# Patient Record
Sex: Female | Born: 1965
Health system: Southern US, Community
[De-identification: ages and names within clinical notes are randomized; demographics above are authoritative.]

## PROBLEM LIST (undated history)

## (undated) DIAGNOSIS — E785 Hyperlipidemia, unspecified: Secondary | ICD-10-CM

## (undated) DIAGNOSIS — I82409 Acute embolism and thrombosis of unspecified deep veins of unspecified lower extremity: Secondary | ICD-10-CM

## (undated) DIAGNOSIS — I10 Essential (primary) hypertension: Secondary | ICD-10-CM

## (undated) DIAGNOSIS — I890 Lymphedema, not elsewhere classified: Secondary | ICD-10-CM

## (undated) DIAGNOSIS — J189 Pneumonia, unspecified organism: Secondary | ICD-10-CM

## (undated) DIAGNOSIS — D649 Anemia, unspecified: Secondary | ICD-10-CM

## (undated) DIAGNOSIS — J40 Bronchitis, not specified as acute or chronic: Secondary | ICD-10-CM

## (undated) HISTORY — DX: Lymphedema, not elsewhere classified: I89.0

## (undated) HISTORY — DX: Anemia, unspecified: D64.9

---

## 2018-10-08 ENCOUNTER — Encounter (HOSPITAL_COMMUNITY): Payer: Self-pay

## 2018-10-08 ENCOUNTER — Emergency Department (HOSPITAL_COMMUNITY): Payer: Medicaid Other

## 2018-10-08 ENCOUNTER — Other Ambulatory Visit: Payer: Self-pay

## 2018-10-08 ENCOUNTER — Encounter (HOSPITAL_COMMUNITY): Payer: Self-pay | Admitting: Anesthesiology

## 2018-10-08 ENCOUNTER — Encounter (HOSPITAL_COMMUNITY)
Admission: EM | Disposition: A | Discharge status: Home / Self Care | Payer: Self-pay | Source: Home / Self Care | Attending: Internal Medicine

## 2018-10-08 ENCOUNTER — Inpatient Hospital Stay (HOSPITAL_COMMUNITY)
Admission: EM | Admit: 2018-10-08 | Discharge: 2018-10-16 | DRG: 299 | Disposition: A | Discharge status: Home / Self Care | Payer: Medicaid Other | Attending: Internal Medicine | Admitting: Internal Medicine

## 2018-10-08 DIAGNOSIS — M170 Bilateral primary osteoarthritis of knee: Secondary | ICD-10-CM | POA: Diagnosis present

## 2018-10-08 DIAGNOSIS — I10 Essential (primary) hypertension: Secondary | ICD-10-CM | POA: Diagnosis present

## 2018-10-08 DIAGNOSIS — Z885 Allergy status to narcotic agent status: Secondary | ICD-10-CM | POA: Diagnosis not present

## 2018-10-08 DIAGNOSIS — Z9119 Patient's noncompliance with other medical treatment and regimen: Secondary | ICD-10-CM

## 2018-10-08 DIAGNOSIS — N393 Stress incontinence (female) (male): Secondary | ICD-10-CM | POA: Diagnosis present

## 2018-10-08 DIAGNOSIS — I82412 Acute embolism and thrombosis of left femoral vein: Secondary | ICD-10-CM | POA: Diagnosis present

## 2018-10-08 DIAGNOSIS — Z7901 Long term (current) use of anticoagulants: Secondary | ICD-10-CM

## 2018-10-08 DIAGNOSIS — R0902 Hypoxemia: Secondary | ICD-10-CM | POA: Diagnosis present

## 2018-10-08 DIAGNOSIS — I82409 Acute embolism and thrombosis of unspecified deep veins of unspecified lower extremity: Secondary | ICD-10-CM | POA: Diagnosis present

## 2018-10-08 DIAGNOSIS — Z6841 Body Mass Index (BMI) 40.0 and over, adult: Secondary | ICD-10-CM | POA: Diagnosis not present

## 2018-10-08 DIAGNOSIS — E785 Hyperlipidemia, unspecified: Secondary | ICD-10-CM | POA: Diagnosis present

## 2018-10-08 DIAGNOSIS — I824Y2 Acute embolism and thrombosis of unspecified deep veins of left proximal lower extremity: Secondary | ICD-10-CM

## 2018-10-08 DIAGNOSIS — I890 Lymphedema, not elsewhere classified: Secondary | ICD-10-CM | POA: Diagnosis present

## 2018-10-08 DIAGNOSIS — Z8249 Family history of ischemic heart disease and other diseases of the circulatory system: Secondary | ICD-10-CM

## 2018-10-08 DIAGNOSIS — M79606 Pain in leg, unspecified: Secondary | ICD-10-CM | POA: Diagnosis present

## 2018-10-08 DIAGNOSIS — D509 Iron deficiency anemia, unspecified: Secondary | ICD-10-CM | POA: Diagnosis present

## 2018-10-08 DIAGNOSIS — I2699 Other pulmonary embolism without acute cor pulmonale: Secondary | ICD-10-CM

## 2018-10-08 DIAGNOSIS — D649 Anemia, unspecified: Secondary | ICD-10-CM

## 2018-10-08 DIAGNOSIS — I82402 Acute embolism and thrombosis of unspecified deep veins of left lower extremity: Secondary | ICD-10-CM

## 2018-10-08 DIAGNOSIS — I82422 Acute embolism and thrombosis of left iliac vein: Secondary | ICD-10-CM | POA: Diagnosis present

## 2018-10-08 DIAGNOSIS — R079 Chest pain, unspecified: Secondary | ICD-10-CM

## 2018-10-08 HISTORY — DX: Pneumonia, unspecified organism: J18.9

## 2018-10-08 HISTORY — DX: Essential (primary) hypertension: I10

## 2018-10-08 HISTORY — DX: Morbid (severe) obesity due to excess calories: E66.01

## 2018-10-08 HISTORY — DX: Hyperlipidemia, unspecified: E78.5

## 2018-10-08 LAB — CBC WITH DIFFERENTIAL/PLATELET
Abs Immature Granulocytes: 0.06 10*3/uL (ref 0.00–0.07)
Basophils Absolute: 0.1 10*3/uL (ref 0.0–0.1)
Basophils Relative: 1 %
Eosinophils Absolute: 0.2 10*3/uL (ref 0.0–0.5)
Eosinophils Relative: 1 %
HCT: 30.9 % — ABNORMAL LOW (ref 36.0–46.0)
Hemoglobin: 7.8 g/dL — ABNORMAL LOW (ref 12.0–15.0)
Immature Granulocytes: 1 %
Lymphocytes Relative: 11 %
Lymphs Abs: 1.3 10*3/uL (ref 0.7–4.0)
MCH: 16.7 pg — ABNORMAL LOW (ref 26.0–34.0)
MCHC: 25.2 g/dL — ABNORMAL LOW (ref 30.0–36.0)
MCV: 66.2 fL — ABNORMAL LOW (ref 80.0–100.0)
Monocytes Absolute: 0.7 10*3/uL (ref 0.1–1.0)
Monocytes Relative: 6 %
Neutro Abs: 9.5 10*3/uL — ABNORMAL HIGH (ref 1.7–7.7)
Neutrophils Relative %: 80 %
Platelets: 365 10*3/uL (ref 150–400)
RBC: 4.67 MIL/uL (ref 3.87–5.11)
RDW: 23.3 % — ABNORMAL HIGH (ref 11.5–15.5)
WBC: 11.7 10*3/uL — ABNORMAL HIGH (ref 4.0–10.5)
nRBC: 0.3 % — ABNORMAL HIGH (ref 0.0–0.2)

## 2018-10-08 LAB — TYPE AND SCREEN
ABO/RH(D): O POS
Antibody Screen: NEGATIVE

## 2018-10-08 LAB — COMPREHENSIVE METABOLIC PANEL
ALT: 21 U/L (ref 0–44)
AST: 22 U/L (ref 15–41)
Albumin: 3.3 g/dL — ABNORMAL LOW (ref 3.5–5.0)
Alkaline Phosphatase: 84 U/L (ref 38–126)
Anion gap: 11 (ref 5–15)
BUN: 14 mg/dL (ref 6–20)
CO2: 24 mmol/L (ref 22–32)
Calcium: 8.7 mg/dL — ABNORMAL LOW (ref 8.9–10.3)
Chloride: 98 mmol/L (ref 98–111)
Creatinine, Ser: 1.05 mg/dL — ABNORMAL HIGH (ref 0.44–1.00)
GFR calc Af Amer: >60 mL/min (ref >60)
GFR calc non Af Amer: >60 mL/min (ref >60)
Glucose, Bld: 124 mg/dL — ABNORMAL HIGH (ref 70–99)
Potassium: 3.3 mmol/L — ABNORMAL LOW (ref 3.5–5.1)
Sodium: 133 mmol/L — ABNORMAL LOW (ref 135–145)
Total Bilirubin: 0.8 mg/dL (ref 0.3–1.2)
Total Protein: 8.4 g/dL — ABNORMAL HIGH (ref 6.5–8.1)

## 2018-10-08 LAB — HEMOGLOBIN A1C
Hgb A1c MFr Bld: 5.4 % (ref 4.8–5.6)
Mean Plasma Glucose: 108.28 mg/dL

## 2018-10-08 LAB — HEPARIN LEVEL (UNFRACTIONATED)
Heparin Unfractionated: <0.1 IU/mL — ABNORMAL LOW (ref 0.30–0.70)
Heparin Unfractionated: 0.6 IU/mL (ref 0.30–0.70)

## 2018-10-08 LAB — TROPONIN I
Troponin I: <0.03 ng/mL (ref <0.03)
Troponin I: 0.06 ng/mL (ref <0.03)

## 2018-10-08 LAB — D-DIMER, QUANTITATIVE (NOT AT ARMC): D-Dimer, Quant: 16.03 ug/mL-FEU — ABNORMAL HIGH (ref 0.00–0.50)

## 2018-10-08 LAB — PROTIME-INR
INR: 1.27
Prothrombin Time: 15.8 seconds — ABNORMAL HIGH (ref 11.4–15.2)

## 2018-10-08 LAB — MAGNESIUM: Magnesium: 2.2 mg/dL (ref 1.7–2.4)

## 2018-10-08 LAB — BRAIN NATRIURETIC PEPTIDE: B Natriuretic Peptide: 28 pg/mL (ref 0.0–100.0)

## 2018-10-08 SURGERY — THROMBECTOMY, VEIN, PERCUTANEOUS
Anesthesia: General | Laterality: Left

## 2018-10-08 MED ORDER — SODIUM CHLORIDE 0.9% FLUSH: 3.0000 mL | INTRAVENOUS | Status: DC | PRN

## 2018-10-08 MED ORDER — HEPARIN (PORCINE) 25000 UT/250ML-% IV SOLN
1700.0000 [IU]/h | INTRAVENOUS | Status: DC
  Administered 2018-10-08 – 2018-10-10 (×4): 1800 [IU]/h via INTRAVENOUS
  Administered 2018-10-11 – 2018-10-15 (×8): 1900 [IU]/h via INTRAVENOUS
  Administered 2018-10-16: 1700 [IU]/h via INTRAVENOUS
  Filled 2018-10-08 (×16): qty 250

## 2018-10-08 MED ORDER — FENTANYL CITRATE (PF) 100 MCG/2ML IJ SOLN
50.0000 ug | Freq: Once | INTRAMUSCULAR | Status: AC
  Administered 2018-10-08: 50 ug via INTRAVENOUS
  Filled 2018-10-08: qty 2

## 2018-10-08 MED ORDER — ONDANSETRON HCL 4 MG/2ML IJ SOLN: 4.0000 mg | Freq: Four times a day (QID) | INTRAMUSCULAR | Status: DC | PRN

## 2018-10-08 MED ORDER — SODIUM CHLORIDE 0.9 % IV SOLN: 250.0000 mL | INTRAVENOUS | Status: DC | PRN

## 2018-10-08 MED ORDER — ZOLPIDEM TARTRATE 5 MG PO TABS: 5.0000 mg | ORAL_TABLET | Freq: Every evening | ORAL | Status: DC | PRN

## 2018-10-08 MED ORDER — ONDANSETRON HCL 4 MG PO TABS: 4.0000 mg | ORAL_TABLET | Freq: Four times a day (QID) | ORAL | Status: DC | PRN

## 2018-10-08 MED ORDER — HEPARIN BOLUS VIA INFUSION
5000.0000 [IU] | Freq: Once | INTRAVENOUS | Status: AC
  Administered 2018-10-08: 5000 [IU] via INTRAVENOUS

## 2018-10-08 MED ORDER — ACETAMINOPHEN 650 MG RE SUPP: 650.0000 mg | Freq: Four times a day (QID) | RECTAL | Status: DC | PRN

## 2018-10-08 MED ORDER — ACETAMINOPHEN 325 MG PO TABS: 650.0000 mg | ORAL_TABLET | Freq: Four times a day (QID) | ORAL | Status: DC | PRN

## 2018-10-08 MED ORDER — SODIUM CHLORIDE 0.9% FLUSH
3.0000 mL | Freq: Two times a day (BID) | INTRAVENOUS | Status: DC
  Administered 2018-10-12 – 2018-10-15 (×4): 3 mL via INTRAVENOUS

## 2018-10-08 MED ORDER — IPRATROPIUM-ALBUTEROL 0.5-2.5 (3) MG/3ML IN SOLN
3.0000 mL | Freq: Once | RESPIRATORY_TRACT | Status: DC
  Filled 2018-10-08: qty 3

## 2018-10-08 MED ORDER — ALBUTEROL SULFATE (2.5 MG/3ML) 0.083% IN NEBU: 2.5000 mg | INHALATION_SOLUTION | RESPIRATORY_TRACT | Status: DC | PRN

## 2018-10-08 MED ORDER — TECHNETIUM TO 99M ALBUMIN AGGREGATED
4.0000 | Freq: Once | INTRAVENOUS | Status: AC | PRN
  Administered 2018-10-08: 4 via INTRAVENOUS

## 2018-10-08 NOTE — ED Provider Notes (Signed)
[facility] EMERGENCY DEPARTMENT Provider Note   CSN: [id number] Arrival date & time: 10/08/18  1147     History   Chief Complaint Chief Complaint  Patient presents with  . Leg Pain    HPI Victoria Cummings is a 52 y.o. female.  HPI   52yF with several complaints. She says she called EMS for CP. Began last night. Sharp pain in L anterior chest like she is being stabbed. She is morbidly obese and very sedentary but says pain worse when she would get up to use the bathroom. Associated with dyspnea. Occasional cough. No fever. Says she is always cold. Chronic severe LE edema. Her L leg has been more swollen and painful recently but not sure of exact timing. Denies trauma. Denies past history of DVT. Also c/o feeling thirsty. Denies polyuria or dysuria. Has stress incontinence when she coughs but this is chronic.   Does not receive regular medical care. She lives locally in a hotel. Does not have a PCP. Says she has been told she has high blood pressure but is not on medications.   Past Medical History:  Diagnosis Date  . Hyperlipidemia   . Hypertension   . Morbid obesity (HCC)   . Pneumonia     There are no active problems to display for this patient.   History reviewed. No pertinent surgical history.   OB History   None      Home Medications    Prior to Admission medications   Not on File    Family History No family history on file.  Social History Social History   Tobacco Use  . Smoking status: Never Smoker  . Smokeless tobacco: Never Used  Substance Use Topics  . Alcohol use: Not Currently  . Drug use: Never     Allergies   Codeine   Review of Systems Review of Systems  All systems reviewed and negative, other than as noted in HPI.  Physical Exam Updated Vital Signs BP 126/72 (BP Location: Left Wrist)   Pulse (!) 114   Temp 97.9 F (36.6 C)   Resp (!) 30   LMP 06/08/2018 Comment: irregular  SpO2 (!) 83%   Physical Exam  Constitutional:  She appears well-developed and well-nourished. No distress.  HENT:  Head: Normocephalic and atraumatic.  Eyes: Conjunctivae are normal. Right eye exhibits no discharge. Left eye exhibits no discharge.  Neck: Neck supple.  Cardiovascular: Regular rhythm and normal heart sounds. Exam reveals no gallop and no friction rub.  No murmur heard. tachycardic  Pulmonary/Chest: She has rales.  Tachypnea. Rales b/l.   Abdominal: Soft. She exhibits no distension. There is no tenderness.  Musculoskeletal: She exhibits edema. She exhibits no tenderness.  Severe lymphedema b/l LE. L leg is noticeably larger than R. Diffuse tenderness but no acute appearing skin lesions/cellulitis. I cannot easily palpate a DP pulse but exam is very limited because of body habitus. Foot doesn't feel cold nor appreciably different than the R.   Neurological: She is alert.  Skin: Skin is warm and dry.  Psychiatric:  Flat affect. Poor eye contact. Speaks in hushed voice.   Nursing note and vitals reviewed.    ED Treatments / Results  Labs (all labs ordered are listed, but only abnormal results are displayed) Labs Reviewed  CBC WITH DIFFERENTIAL/PLATELET - Abnormal; Notable for the following components:      Result Value   WBC 11.7 (*)    Hemoglobin 7.8 (*)    HCT 30.9 (*)  MCV 66.2 (*)    MCH 16.7 (*)    MCHC 25.2 (*)    RDW 23.3 (*)    nRBC 0.3 (*)    Neutro Abs 9.5 (*)    All other components within normal limits  COMPREHENSIVE METABOLIC PANEL - Abnormal; Notable for the following components:   Sodium 133 (*)    Potassium 3.3 (*)    Glucose, Bld 124 (*)    Creatinine, Ser 1.05 (*)    Calcium 8.7 (*)    Total Protein 8.4 (*)    Albumin 3.3 (*)    All other components within normal limits  D-DIMER, QUANTITATIVE (NOT AT [facility]) - Abnormal; Notable for the following components:   D-Dimer, Quant 16.03 (*)    All other components within normal limits  PROTIME-INR - Abnormal; Notable for the following  components:   Prothrombin Time 15.8 (*)    All other components within normal limits  BRAIN NATRIURETIC PEPTIDE  TROPONIN I  MAGNESIUM  URINALYSIS, ROUTINE W REFLEX MICROSCOPIC  HEMOGLOBIN A1C  HEPARIN LEVEL (UNFRACTIONATED)  TYPE AND SCREEN    EKG EKG Interpretation  Date/Time:  Wednesday October 08 2018 12:08:45 EST Ventricular Rate:  109 PR Interval:    QRS Duration: 98 QT Interval:  344 QTC Calculation: 464 R Axis:   -33 Text Interpretation:  Sinus tachycardia Left axis deviation No old tracing to compare Confirmed by Raeford RazorKohut, Ndidi Nesby [phone]([id number]) on 10/08/2018 12:15:49 PM   Radiology [place] Venous Img Lower Bilateral  Result Date: 10/08/2018 CLINICAL DATA:  52 year old female with a history of left lower extremity pain and lymphedema EXAM: BILATERAL LOWER EXTREMITY VENOUS DOPPLER ULTRASOUND TECHNIQUE: Gray-scale sonography with graded compression, as well as color Doppler and duplex ultrasound were performed to evaluate the lower extremity deep venous systems from the level of the common femoral vein and including the common femoral, femoral, profunda femoral, popliteal and calf veins including the posterior tibial, peroneal and gastrocnemius veins when visible. The superficial great saphenous vein was also interrogated. Spectral Doppler was utilized to evaluate flow at rest and with distal augmentation maneuvers in the common femoral, femoral and popliteal veins. COMPARISON:  None. FINDINGS: RIGHT LOWER EXTREMITY Common Femoral Vein: No evidence of thrombus. Normal compressibility, respiratory phasicity and response to augmentation. Saphenofemoral Junction: No evidence of thrombus. Normal compressibility and flow on color Doppler imaging. Profunda Femoral Vein: No evidence of thrombus. Normal compressibility and flow on color Doppler imaging. Femoral Vein: No evidence of thrombus. Normal compressibility, respiratory phasicity and response to augmentation. Popliteal Vein: No evidence of  thrombus. Normal compressibility, respiratory phasicity and response to augmentation. Calf Veins: Calf veins not visualized Superficial Great Saphenous Vein: No evidence of thrombus. Normal compressibility and flow on color Doppler imaging. Other Findings:  None. LEFT LOWER EXTREMITY Common Femoral Vein: Occlusive DVT of the common femoral vein extending into the profunda femoris and the saphenofemoral junction. Thrombus extends beyond the field of view into the iliac veins. Saphenofemoral Junction: Thrombus of the saphenofemoral junction. Profunda Femoral Vein: Occlusive thrombus of the profunda vein as it joins common femoral vein Femoral Vein: Occlusive DVT through the femoral vein extending at least to the mid segment. Patient was unable to tolerate a more comprehensive exam of the distal femoral vein and popliteal vein. Popliteal Vein: Not interrogated Calf Veins: Not interrogated Other Findings:  None. IMPRESSION: Right: Sonographic survey of the right lower extremity negative for DVT. Left: Occlusive proximal DVT of the common femoral vein, profunda vein, saphenofemoral junction, and extending at least distally  to the mid femoral vein. Patient did not tolerate comprehensive exam of the distal femoral vein and popliteal vein. Evidence of extension of DVT in the left iliac veins. These results were called by telephone at the time of interpretation on 10/08/2018 at 1:06 pm to Dr. Raeford Razor , who verbally acknowledged these results. Signed, Yvone Neu. Reyne Dumas, RPVI [facility] Electronically Signed   By: Gilmer Mor D.O.   On: 10/08/2018 13:06   Dg Chest Portable 1 View  Result Date: 10/08/2018 CLINICAL DATA:  dyspnea. cough. sharp L sided CP. EXAM: PORTABLE CHEST 1 VIEW COMPARISON:  None. FINDINGS: Normal heart size. Lungs clear. No pneumothorax. No pleural effusion. IMPRESSION: No active cardiopulmonary disease. Electronically Signed   By:  Jolaine Click M.D.   On: 10/08/2018 13:07    Procedures Procedures (including critical care time)  CRITICAL CARE Performed by: Raeford Razor Total critical care time: 50 minutes Critical care time was exclusive of separately billable procedures and treating other patients. Critical care was necessary to treat or prevent imminent or life-threatening deterioration. Critical care was time spent personally by me on the following activities: development of treatment plan with patient and/or surrogate as well as nursing, discussions with consultants, evaluation of patient's response to treatment, examination of patient, obtaining history from patient or surrogate, ordering and performing treatments and interventions, ordering and review of laboratory studies, ordering and review of radiographic studies, pulse oximetry and re-evaluation of patient's condition.   Medications Ordered in ED Medications - No data to display   Initial Impression / Assessment and Plan / ED Course  I have reviewed the triage vital signs and the nursing notes.  Pertinent labs & imaging results that were available during my care of the patient were reviewed by me and considered in my medical decision making (see chart for details).     52yF with multiple complaints. Sharp L sided CP which is worse with minimal activity but says she is only capable of minimal activity. Sharp brief nature seems atypical for ACS. Will check troponin though. EKG with sinus tachycardia. NS ST changes. No old for comparison purposes.   Hypoxic on RA. Consider CHF. Consider infectious. B/l rales. Check CXR. Check BNP.   Consider PE. She is essentially bed bound. Severe LE edema, with L>R. Will [place] her LEs.  Will check d-dimer. If venous US negative and d-dimer elevated then will get a VQ scan. We cannot even stand her to get a weight. She says she is at least 450 lbs. We're not going to be able to obtain CTa even if renal function is fine  because of her weight/body habitus.   She is morbidly obese and doesn't have routine medical care. She is hypoxic on RA and not on supplemental oxygen at baseline. She will require admission. She likely has undiagnosed underlying medical problems and I'm not going to be able to immediately sort out what may be an acute issue versus more chronic. No prior records for review. She lives alone in a hotel and from my initial conversation it doesn't sound like she has much social support. Will also need SW/CM involvement.   1:09 PM Called by Dr Loreta Ave, radiology. Pt does have extensive clot in LLE extending into iliac. She may be a candidate for thrombolysis by IR, but would have to be at [facility] for this.   Clinically she had PE based on the ultrasound, sharp CP and hypoxemia. Unfractionated heparin ordered for now.  She is having a significant amount of pain in her leg. Consider phlegmasia. It's hard to get a physical exam I am confident in to monitor signs of arterial insufficiency. It's also going to be difficult to simply keep her leg elevated. I cannot doppler a DP pulse but she is so edematous and skin very thick that this isn't surprising.  I cannot even position he probe to try to assess PT pulse. Her foot doesn't feel cold.   1:48 PM I discussed briefly with Dr Early via OR staff. He is currently in surgery. Recommending admission to either hospitalist service or CCM and agrees with transfer to [facility]. Vascular surgery can follow along in consult for further recs.   2:11 PM Discussed with Dr Arbie Cookey again. He would like pt transferred to ED at [facility] expeditiously for further evaluation. Discussed with Dr Judd Lien, EDP. Hospitalist service will need to be consulted as well for the admission.   Final Clinical Impressions(s) / ED Diagnoses   Final diagnoses:  Acute deep vein thrombosis (DVT) of proximal vein of left lower extremity (HCC)  Other acute pulmonary embolism, unspecified whether acute cor  pulmonale present (HCC)  Anemia, unspecified type    ED Discharge Orders    None       Raeford Razor, MD 10/08/18 1417

## 2018-10-08 NOTE — H&P (Signed)
[facility]                                                                                    Patient Demographics  Victoria Cummings, is a 52 y.o. female  CSN: [id number]  MRN: [id number]  DOB - 1965-11-10  Admit Date - 10/08/2018  Outpatient Primary MD for the patient is Patient, No Pcp Per   With History of -  Past Medical History:  Diagnosis Date  . Hyperlipidemia   . Hypertension   . Morbid obesity (HCC)   . Pneumonia       History reviewed. No pertinent surgical history.  in for   Chief Complaint  Patient presents with  . Leg Pain     HPI  Victoria Cummings  is a 52 y.o. female, with past medical history significant for hypertension, hyperlipidemia and progressive lymphedema presenting with 1 day history of chest pain, substernal on and off, worse with exercise, nonradiating, not associated with cold sweats, nausea or vomiting.  Patient denies any history of PEs or DVTs.  Patient denies any dizziness or loss of consciousness.  Patient is chest pain-free at this time and just underwent a VQ scan which is still pending.  She was seen by vascular surgery in the ER earlier for concerns of phlegmasia but there was no evidence of arterial compromise.  Patient has a history of noncompliance and is not taking any medications at home.  Left leg Doppler done at any pen showed left extensive DVT in her left iliac veins, common femoral vein, and popliteal veins .    Review of Systems    In addition to the HPI above,  No Fever-chills, No Headache, No changes with Vision or hearing, No problems swallowing food or Liquids, No Abdominal pain, No Nausea or Vommitting, Bowel movements are regular, No Blood in stool or Urine, No dysuria, No new skin rashes or bruises, No new joints pains-aches,  No new weakness, tingling, numbness in any extremity, No recent weight gain or loss, No polyuria, polydypsia or polyphagia, No significant Mental Stressors.  A full 10  point Review of Systems was done, except as stated above, all other Review of Systems were negative.   Social History Social History   Tobacco Use  . Smoking status: Never Smoker  . Smokeless tobacco: Never Used  Substance Use Topics  . Alcohol use: Not Currently     Family History Significant for hypertension  Prior to Admission medications   Not on File    Allergies  Allergen Reactions  . Codeine     Physical Exam  Vitals  Blood pressure (!) 148/66, pulse (!) 105, temperature 98 F (36.7 C), temperature source Oral, resp. rate (!) 22, height 5\' 5"  (1.651 m), weight (!) 204.1 kg, last menstrual period 06/08/2018, SpO2 98 %.   1. General well-developed, well-nourished female looks acutely ill  2. Normal affect and insight, Not Suicidal or Homicidal, Awake Alert, Oriented X 3.  3. No F.N deficits, grossly, patient moving all extremities.  4. Ears and Eyes appear Normal, Conjunctivae clear, PERRLA. Moist Oral Mucosa.  5. Supple Neck, No JVD, No cervical lymphadenopathy appriciated, No Carotid Bruits.  6. Symmetrical Chest wall movement, Good air movement bilaterally, CTAB.  7. RRR, No Gallops, Rubs or Murmurs, No Parasternal Heave.  8. Positive Bowel Sounds, Abdomen Soft, Non tender, No organomegaly appriciated,No rebound -guarding or rigidity.  9.  No Cyanosis, Normal Skin Turgor, No Skin Rash or Bruise.  10. Good muscle tone,  joints appear normal , no effusions, Normal ROM.    Data Review  CBC Recent Labs  Lab 10/08/18 1325  WBC 11.7*  HGB 7.8*  HCT 30.9*  PLT 365  MCV 66.2*  MCH 16.7*  MCHC 25.2*  RDW 23.3*  LYMPHSABS 1.3  MONOABS 0.7  EOSABS 0.2  BASOSABS 0.1   ------------------------------------------------------------------------------------------------------------------  Chemistries  Recent Labs  Lab 10/08/18 1325  NA 133*  K 3.3*  CL 98  CO2 24  GLUCOSE 124*  BUN 14  CREATININE 1.05*  CALCIUM 8.7*  MG 2.2  AST 22  ALT  21  ALKPHOS 84  BILITOT 0.8   ------------------------------------------------------------------------------------------------------------------ estimated creatinine clearance is 112 mL/min (A) (by C-G formula based on SCr of 1.05 mg/dL (H)). ------------------------------------------------------------------------------------------------------------------ No results for input(s): TSH, T4TOTAL, T3FREE, THYROIDAB in the last 72 hours.  Invalid input(s): FREET3   Coagulation profile Recent Labs  Lab 10/08/18 1327  INR 1.27   ------------------------------------------------------------------------------------------------------------------- Recent Labs    10/08/18 1327  DDIMER 16.03*   -------------------------------------------------------------------------------------------------------------------  Cardiac Enzymes Recent Labs  Lab 10/08/18 1325  TROPONINI <0.03   ------------------------------------------------------------------------------------------------------------------ Invalid input(s): POCBNP   ---------------------------------------------------------------------------------------------------------------  Urinalysis No results found for: COLORURINE, APPEARANCEUR, LABSPEC, PHURINE, GLUCOSEU, HGBUR, BILIRUBINUR, KETONESUR, PROTEINUR, UROBILINOGEN, NITRITE, LEUKOCYTESUR  ----------------------------------------------------------------------------------------------------------------   Imaging results:   US Venous Img Lower Bilateral  Result Date: 10/08/2018 CLINICAL DATA:  52 year old female with a history of left lower extremity pain and lymphedema EXAM: BILATERAL LOWER EXTREMITY VENOUS DOPPLER ULTRASOUND TECHNIQUE: Gray-scale sonography with graded compression, as well as color Doppler and duplex ultrasound were performed to evaluate the lower extremity deep venous systems from the level of the common femoral vein and including the common femoral, femoral, profunda  femoral, popliteal and calf veins including the posterior tibial, peroneal and gastrocnemius veins when visible. The superficial great saphenous vein was also interrogated. Spectral Doppler was utilized to evaluate flow at rest and with distal augmentation maneuvers in the common femoral, femoral and popliteal veins. COMPARISON:  None. FINDINGS: RIGHT LOWER EXTREMITY Common Femoral Vein: No evidence of thrombus. Normal compressibility, respiratory phasicity and response to augmentation. Saphenofemoral Junction: No evidence of thrombus. Normal compressibility and flow on color Doppler imaging. Profunda Femoral Vein: No evidence of thrombus. Normal compressibility and flow on color Doppler imaging. Femoral Vein: No evidence of thrombus. Normal compressibility, respiratory phasicity and response to augmentation. Popliteal Vein: No evidence of thrombus. Normal compressibility, respiratory phasicity and response to augmentation. Calf Veins: Calf veins not visualized Superficial Great Saphenous Vein: No evidence of thrombus. Normal compressibility and flow on color Doppler imaging. Other Findings:  None. LEFT LOWER EXTREMITY Common Femoral Vein: Occlusive DVT of the common femoral vein extending into the profunda femoris and the saphenofemoral junction. Thrombus extends beyond the field of view into the iliac veins. Saphenofemoral Junction: Thrombus of the saphenofemoral junction. Profunda Femoral Vein: Occlusive thrombus of the profunda vein as it joins common femoral vein Femoral Vein: Occlusive DVT through the femoral vein extending at least to the mid segment. Patient was unable to tolerate a more comprehensive exam of the distal femoral vein and popliteal vein. Popliteal Vein: Not interrogated Calf Veins: Not interrogated Other Findings:  None. IMPRESSION:  Right: Sonographic survey of the right lower extremity negative for DVT. Left: Occlusive proximal DVT of the common femoral vein, profunda vein, saphenofemoral  junction, and extending at least distally to the mid femoral vein. Patient did not tolerate comprehensive exam of the distal femoral vein and popliteal vein. Evidence of extension of DVT in the left iliac veins. These results were called by telephone at the time of interpretation on 10/08/2018 at 1:06 pm to Dr. Raeford RazorSTEPHEN KOHUT , who verbally acknowledged these results. Signed, Yvone NeuJaime S. Reyne DumasWagner, DO, RPVI [facility] Electronically Signed   By: Gilmer MorJaime  Wagner D.O.   On: 10/08/2018 13:06   Dg Chest Portable 1 View  Result Date: 10/08/2018 CLINICAL DATA:  dyspnea. cough. sharp L sided CP. EXAM: PORTABLE CHEST 1 VIEW COMPARISON:  None. FINDINGS: Normal heart size. Lungs clear. No pneumothorax. No pleural effusion. IMPRESSION: No active cardiopulmonary disease. Electronically Signed   By: Jolaine ClickArthur  Hoss M.D.   On: 10/08/2018 13:07    My personal review of EKG: Rhythm NSR, sinus tach at 109 bpm with left axis deviation  Assessment & Plan  Left leg extensive DVT Will start patient on heparin drip Awaiting VQ scan results No history of hypercoagulable state or DVTs  Chest pain rule out PE , VQ scan pending Serial troponins  Progressive lymphedema of lower extremities  History of hypertension , not on medications  History of hyperlipidemia, not on medications     DVT Prophylaxis heparin full dose  AM Labs Ordered, also please review Full Orders    Code Status full  Disposition Plan: Home  Time spent in minutes : 38 minutes  Condition GUARDED   @SIGNATURE @

## 2018-10-08 NOTE — ED Triage Notes (Signed)
Pt brought in by ems. Pt lives in hotel. Originally EMS called for CP , but once arrived complained of left leg pain with lower leg hurting worse. Pt is morbidly obese with lymphedema noted

## 2018-10-08 NOTE — ED Notes (Signed)
Attempted report x 1, call back number left. 

## 2018-10-08 NOTE — ED Provider Notes (Addendum)
52 year old morbidly obese woman comes in with chief complaint of chest pain.  She was found to have DVT and elevated d-dimer.  Patient had come in with chest pain and is complaining of some shortness of breath, therefore we will order a VQ scan.  Heparin has been initiated. Dr. Arbie CookeyEarly, vascular surgery has been notified.  Patient is complaining of leg pain but she has no active numbness or tingling.  Extremities warm to touch and she is able to wiggle her toes.   Derwood KaplanNanavati, Jenetta Wease, MD 10/08/18 1533  4:14 PM  Dr. Arbie CookeyEarly, clears the patient from vascular perspective.  4:14 PM IMPRESSION: Right:  Sonographic survey of the right lower extremity negative for DVT.  Left:  Occlusive proximal DVT of the common femoral vein, profunda vein, saphenofemoral junction, and extending at least distally to the mid femoral vein. Patient did not tolerate comprehensive exam of the distal femoral vein and popliteal vein.  Evidence of extension of DVT in the left iliac veins.  These results were called by telephone at the time of interpretation on 10/08/2018 at 1:06 pm to Dr. Raeford RazorSTEPHEN KOHUT , who verbally acknowledged these results.   Derwood KaplanNanavati, Lenix Benoist, MD 10/08/18 [phone]

## 2018-10-08 NOTE — Progress Notes (Signed)
ANTICOAGULATION CONSULT NOTE - Initial Consult  Pharmacy Consult for Heparin Indication: DVT  Allergies  Allergen Reactions  . Codeine     Patient Measurements: Height: 5\' 5"  (165.1 cm) Weight: (!) 450 lb (204.1 kg) IBW/kg (Calculated) : 57 HEPARIN DW (KG): 111.1  Vital Signs: Temp: 97.9 F (36.6 C) (12/04 1157) BP: 126/72 (12/04 1157) Pulse Rate: 114 (12/04 1157)  Labs: No results for input(s): HGB, HCT, PLT, APTT, LABPROT, INR, HEPARINUNFRC, HEPRLOWMOCWT, CREATININE, CKTOTAL, CKMB, TROPONINI in the last 72 hours.  CrCl cannot be calculated (No successful lab value found.).   Medical History: Past Medical History:  Diagnosis Date  . Hyperlipidemia   . Hypertension   . Morbid obesity (HCC)   . Pneumonia     Medications:  See med rec  Assessment: 52 yo brought to ED with leg pain. Venous U/S shows Occlusive DVT of the Left common femoral vein extending into the profunda femoris and the saphenofemoral junction.Thrombus extends beyond the field of view into the iliac veins. Pharmacy asked to start heparin.  Goal of Therapy:  Heparin level 0.3-0.7 units/ml Monitor platelets by anticoagulation protocol: Yes   Plan:  Give 5000 units bolus x 1 Start heparin infusion at 1800 units/hr Check anti-Xa level in 6-8 hours and daily while on heparin Continue to monitor H&H and platelets   Elder CyphersLorie Odie Edmonds, BS Loura BackPharm D, BCPS Clinical Pharmacist Pager [phone] 10/08/2018,1:20 PM

## 2018-10-08 NOTE — ED Notes (Signed)
Pt received by this RN from [facility] via [facility] for left leg pain x 2 weeks and shob that the pt states "is not new" Pt is pain free at this time as long as she is lying still. VSS, NAD. Axox4.

## 2018-10-08 NOTE — Progress Notes (Signed)
CRITICAL VALUE ALERT  Critical Value: Troponin 0.06  Date & Time Notied:  10/08/18 2030  Provider Notified: Schorr NP  Orders Received/Actions taken: Pending

## 2018-10-08 NOTE — Progress Notes (Signed)
ANTICOAGULATION CONSULT NOTE - Follow-Up Consult  Pharmacy Consult for Heparin Indication: DVT  Allergies  Allergen Reactions  . Codeine     Patient Measurements: Height: 5\' 5"  (165.1 cm) Weight: (!) 450 lb (204.1 kg) IBW/kg (Calculated) : 57 HEPARIN DW (KG): 111.1  Vital Signs: Temp: (P) 98.4 F (36.9 C) (12/04 1836) Temp Source: (P) Oral (12/04 1836) BP: 145/65 (12/04 1728) Pulse Rate: 100 (12/04 1728)  Labs: Recent Labs    10/08/18 1325 10/08/18 1327 10/08/18 1858  HGB 7.8*  --   --   HCT 30.9*  --   --   PLT 365  --   --   LABPROT  --  15.8*  --   INR  --  1.27  --   HEPARINUNFRC  --  <0.10* 0.60  CREATININE 1.05*  --   --   TROPONINI <0.03  --   --     Estimated Creatinine Clearance: 112 mL/min (A) (by C-G formula based on SCr of 1.05 mg/dL (H)).   Medical History: Past Medical History:  Diagnosis Date  . Hyperlipidemia   . Hypertension   . Morbid obesity (HCC)   . Pneumonia      Assessment: 52 yo brought to ED with leg pain found to have acute DVT. VQ scan intermediate probability for PE. Pharmacy consulted for heparin. Initial heparin level therapeutic at 0.60.  Goal of Therapy:  Heparin level 0.3-0.7 units/ml Monitor platelets by anticoagulation protocol: Yes   Plan:  -Continue heparin 1800 units/hr -Recheck heparin level with morning labs  Fredonia HighlandMichael Payal Stanforth, PharmD, BCPS Clinical Pharmacist [phone] Please check AMION for all [facility] numbers 10/08/2018

## 2018-10-08 NOTE — Consult Note (Signed)
Vascular and Vein Specialist of [facility]  Patient name: Victoria Cummings MRN: [id number] DOB: 1966-08-25 Sex: female    HPI: Victoria Cummings is a 52 y.o. female transferred from [facility] with concern of phlegmasia.  She has a long history of severe progressive lymphedema.  She reports that this is been chronic and progressive for many years.  She is morbidly obese with limited mobility.  She reports that she began having leg pain over the last several weeks which has progressed and she presented to the [facility] emergency room today.  Also reports new stabbing chest pain and some shortness of breath as well.  She was evaluated [facility] and underwent lower extremity venous duplex which showed extensive DVT in her left iliac veins, common femoral vein, femoral vein and popliteal vein.  Noted to have motor and sensory function intact but the emergency room provider could not obtain Doppler flow and was concerned about potential phlegmasia and therefore she was transferred to [facility] for further evaluation and possible lysis.  She denies any prior history of pulmonary embolus or DVT.  No prior history of hypercoagulable state.  She has known hypertension but is not on any medication and does not seek medical help.  Past Medical History:  Diagnosis Date  . Hyperlipidemia   . Hypertension   . Morbid obesity (HCC)   . Pneumonia     No family history on file.  SOCIAL HISTORY: Social History   Tobacco Use  . Smoking status: Never Smoker  . Smokeless tobacco: Never Used  Substance Use Topics  . Alcohol use: Not Currently    Allergies  Allergen Reactions  . Codeine     Current Facility-Administered Medications  Medication Dose Route Frequency Provider Last Rate Last Dose  . heparin ADULT infusion 100 units/mL (25000 units/238mL sodium chloride 0.45%)  1,800 Units/hr Intravenous Continuous Raeford Razor, MD 18 mL/hr  at 10/08/18 1419 1,800 Units/hr at 10/08/18 1419  . ipratropium-albuterol (DUONEB) 0.5-2.5 (3) MG/3ML nebulizer solution 3 mL  3 mL Nebulization Once Raeford Razor, MD       No current outpatient medications on file.    REVIEW OF SYSTEMS:  [X]  denotes positive finding, [ ]  denotes negative finding Cardiac  Comments:  Chest pain or chest pressure: x   Shortness of breath upon exertion: x   Short of breath when lying flat: x   Irregular heart rhythm:        Vascular    Pain in calf, thigh, or hip brought on by ambulation:    Pain in feet at night that wakes you up from your sleep:     Blood clot in your veins:    Leg swelling:  x         PHYSICAL EXAM: Vitals:   10/08/18 1157 10/08/18 1317 10/08/18 1422 10/08/18 1532  BP: 126/72  (!) 146/69 (!) 148/66  Pulse: (!) 114  (!) 103 (!) 105  Resp: (!) 30  (!) 22 (!) 22  Temp: 97.9 F (36.6 C)   98 F (36.7 C)  TempSrc:    Oral  SpO2: (!) 83%  93% 98%  Weight:  (!) 204.1 kg    Height:  5\' 5"  (1.651 m)      GENERAL: The patient is a well-nourished female, in no acute distress. The vital signs are documented above. CARDIOVASCULAR: Her feet are warm bilaterally.  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  He does have some tenderness in her left leg versus her right.  She does not have palpable pulses.  She does have biphasic posterior tibial pulse bilaterally.  Her posterior tibial pulse was obtained in listening with a Doppler probe between several folds in her massive elephantitis. PULMONARY: There is good air exchange  MUSCULOSKELETAL: There are no major deformities or cyanosis. NEUROLOGIC: No focal weakness or paresthesias are detected.  Motor and sensory intact in her left and right foot SKIN: There are no ulcers or rashes noted. PSYCHIATRIC: The patient has a normal affect.  DATA:  Lower extremity duplex with extensive left leg DVT as stated  above  MEDICAL ISSUES: Extensive left leg DVT and possible PE.  No evidence of arterial compromise.  No indication for thrombolysis or mechanical thrombectomy.  Difficult management in patient with extensive progressive end-stage lymphedema bilaterally.  Recommend admission for elevation and work-up of shortness of breath and possible PE.  Recommend anticoagulation as is being done.    Larina Earthlyodd F. Viann Nielson, MD FACS [facility] Office Tel [phone] Pager [phone]

## 2018-10-08 NOTE — Anesthesia Preprocedure Evaluation (Deleted)
Anesthesia Evaluation    Airway        Dental   Pulmonary           Cardiovascular hypertension,      Neuro/Psych    GI/Hepatic   Endo/Other  Morbid obesity  Renal/GU      Musculoskeletal   Abdominal   Peds  Hematology  (+) Blood dyscrasia (Hb 7.8), ,   Anesthesia Other Findings   Reproductive/Obstetrics                             Anesthesia Physical Anesthesia Plan  ASA: III and emergent  Anesthesia Plan: General   Post-op Pain Management:    Induction: Intravenous and Rapid sequence  PONV Risk Score and Plan: 3 and Ondansetron, Dexamethasone and Treatment may vary due to age or medical condition  Airway Management Planned: Oral ETT  Additional Equipment:   Intra-op Plan:   Post-operative Plan: Extubation in OR  Informed Consent:   Plan Discussed with:   Anesthesia Plan Comments:         Anesthesia Quick Evaluation

## 2018-10-08 NOTE — ED Notes (Signed)
Unable to weigh patient - patient cannot stand  Patient states that she weights "at least 450 lbs."

## 2018-10-08 NOTE — ED Notes (Signed)
Vascular at bedside

## 2018-10-09 ENCOUNTER — Inpatient Hospital Stay (HOSPITAL_COMMUNITY): Payer: Medicaid Other

## 2018-10-09 ENCOUNTER — Encounter (HOSPITAL_COMMUNITY): Payer: Self-pay | Admitting: *Deleted

## 2018-10-09 DIAGNOSIS — I361 Nonrheumatic tricuspid (valve) insufficiency: Secondary | ICD-10-CM

## 2018-10-09 LAB — URINALYSIS, ROUTINE W REFLEX MICROSCOPIC
Bilirubin Urine: NEGATIVE
Glucose, UA: NEGATIVE mg/dL
HGB URINE DIPSTICK: NEGATIVE
Ketones, ur: 5 mg/dL — AB
Nitrite: NEGATIVE
Protein, ur: NEGATIVE mg/dL
Specific Gravity, Urine: 1.02 (ref 1.005–1.030)
pH: 7 (ref 5.0–8.0)

## 2018-10-09 LAB — CBC
HCT: 26.6 % — ABNORMAL LOW (ref 36.0–46.0)
HEMOGLOBIN: 6.7 g/dL — AB (ref 12.0–15.0)
MCH: 16.3 pg — ABNORMAL LOW (ref 26.0–34.0)
MCHC: 25.2 g/dL — AB (ref 30.0–36.0)
MCV: 64.9 fL — ABNORMAL LOW (ref 80.0–100.0)
Platelets: 360 10*3/uL (ref 150–400)
RBC: 4.1 MIL/uL (ref 3.87–5.11)
RDW: 22.8 % — ABNORMAL HIGH (ref 11.5–15.5)
WBC: 9.9 10*3/uL (ref 4.0–10.5)
nRBC: 0.4 % — ABNORMAL HIGH (ref 0.0–0.2)

## 2018-10-09 LAB — TROPONIN I
Troponin I: 0.06 ng/mL (ref <0.03)
Troponin I: 0.04 ng/mL (ref <0.03)

## 2018-10-09 LAB — BASIC METABOLIC PANEL
Anion gap: 15 (ref 5–15)
BUN: 13 mg/dL (ref 6–20)
CO2: 23 mmol/L (ref 22–32)
Calcium: 8.5 mg/dL — ABNORMAL LOW (ref 8.9–10.3)
Chloride: 97 mmol/L — ABNORMAL LOW (ref 98–111)
Creatinine, Ser: 0.99 mg/dL (ref 0.44–1.00)
GFR calc Af Amer: >60 mL/min (ref >60)
GFR calc non Af Amer: >60 mL/min (ref >60)
Glucose, Bld: 126 mg/dL — ABNORMAL HIGH (ref 70–99)
POTASSIUM: 3.3 mmol/L — AB (ref 3.5–5.1)
Sodium: 135 mmol/L (ref 135–145)

## 2018-10-09 LAB — HIV ANTIBODY (ROUTINE TESTING W REFLEX): HIV Screen 4th Generation wRfx: NONREACTIVE

## 2018-10-09 LAB — ECHOCARDIOGRAM COMPLETE
Height: 65 in
Weight: 7200 oz

## 2018-10-09 LAB — HEMOGLOBIN AND HEMATOCRIT, BLOOD
HCT: 26.8 % — ABNORMAL LOW (ref 36.0–46.0)
Hemoglobin: 6.9 g/dL — CL (ref 12.0–15.0)

## 2018-10-09 LAB — ABO/RH: ABO/RH(D): O POS

## 2018-10-09 LAB — HEPARIN LEVEL (UNFRACTIONATED): Heparin Unfractionated: 0.61 IU/mL (ref 0.30–0.70)

## 2018-10-09 LAB — PREPARE RBC (CROSSMATCH)

## 2018-10-09 MED ORDER — POTASSIUM CHLORIDE CRYS ER 20 MEQ PO TBCR
40.0000 meq | EXTENDED_RELEASE_TABLET | Freq: Once | ORAL | Status: AC
  Administered 2018-10-09: 40 meq via ORAL
  Filled 2018-10-09: qty 2

## 2018-10-09 MED ORDER — POTASSIUM CHLORIDE CRYS ER 20 MEQ PO TBCR: 40.0000 meq | EXTENDED_RELEASE_TABLET | Freq: Two times a day (BID) | ORAL | Status: DC

## 2018-10-09 MED ORDER — PERFLUTREN LIPID MICROSPHERE
4.0000 mL | Freq: Once | INTRAVENOUS | Status: AC
  Administered 2018-10-09: 4 mL via INTRAVENOUS
  Filled 2018-10-09: qty 4

## 2018-10-09 MED ORDER — SODIUM CHLORIDE 0.9% IV SOLUTION: Freq: Once | INTRAVENOUS | Status: DC

## 2018-10-09 NOTE — Progress Notes (Signed)
Acknowledge referral regarding anticoagulation needs- pt has no insurance, could use newer DOACs and transition with 30 day free card- would need to f/u with pt assistance programs with this option,  other option would be for cheaper drugs such as coumadin however pt would then have cost for blood checks- CM will f/u on choice of drug and assist as needed will also speak with pt 12/6 regarding PCP needs.

## 2018-10-09 NOTE — Progress Notes (Addendum)
  Progress Note    10/09/2018 10:55 AM Hospital Day 1  Subjective:  Says her breathing is better but continues to have leg pain-no better no worse.   Vitals:   10/09/18 0000 10/09/18 0338  BP: (!) 141/77 138/80  Pulse:  96  Resp: 15 20  Temp: 99.6 F (37.6 C) 98.9 F (37.2 C)  SpO2: 94% 97%    Physical Exam: Cardiac:  regular Lungs:  Non labored Extremities:  +doppler signal left DP; leg unchanged from yesterday  CBC    Component Value Date/Time   WBC 9.9 10/09/2018 0558   RBC 4.10 10/09/2018 0558   HGB 6.7 (LL) 10/09/2018 0558   HCT 26.6 (L) 10/09/2018 0558   PLT 360 10/09/2018 0558   MCV 64.9 (L) 10/09/2018 0558   MCH 16.3 (L) 10/09/2018 0558   MCHC 25.2 (L) 10/09/2018 0558   RDW 22.8 (H) 10/09/2018 0558   LYMPHSABS 1.3 10/08/2018 1325   MONOABS 0.7 10/08/2018 1325   EOSABS 0.2 10/08/2018 1325   BASOSABS 0.1 10/08/2018 1325    BMET    Component Value Date/Time   NA 135 10/09/2018 0558   K 3.3 (L) 10/09/2018 0558   CL 97 (L) 10/09/2018 0558   CO2 23 10/09/2018 0558   GLUCOSE 126 (H) 10/09/2018 0558   BUN 13 10/09/2018 0558   CREATININE 0.99 10/09/2018 0558   CALCIUM 8.5 (L) 10/09/2018 0558   GFRNONAA >60 10/09/2018 0558   GFRAA >60 10/09/2018 0558    INR    Component Value Date/Time   INR 1.27 10/08/2018 1327    No intake or output data in the 24 hours ending 10/09/18 1055   Assessment/Plan:  52 y.o. female with DVT Hospital Day 1  -pt continues to have +doppler signal left PT -shortness of breath improved today and 97% ON 2LO2NC -continue heparin.  No indication for surgical intervention. -vascular will sign off.  Please call if needed.    Doreatha MassedSamantha Rhyne, PA-C [facility] [phone] 10/09/2018 10:55 AM  I have examined the patient, reviewed and agree with above.  Perfusion scan intermediate probability for pulmonary embolus.  Unable to do evaluation due to size constraints.  Change in her leg.  Massive edema  related to severe chronic lymphedema.  No role for thrombolysis or other intervention.  Will not follow actively.  Recommend conversion to oral anticoagulation as is being done  Gretta Beganodd Charnice Zwilling, MD 10/09/2018 11:17 AM

## 2018-10-09 NOTE — Progress Notes (Signed)
  Echocardiogram 2D Echocardiogram has been performed.  Victoria ChimesWendy  Victoria Cummings 10/09/2018, 2:06 PM

## 2018-10-09 NOTE — Plan of Care (Signed)

## 2018-10-09 NOTE — Progress Notes (Signed)
PROGRESS NOTE  Mikaella Escalona ZOX:[id number] DOB: 11-20-1965 DOA: 10/08/2018 PCP: Patient, No Pcp Per  HPI/Recap of past 24 hours:  Mertice Uffelman  is a 52 y.o. female, with past medical history significant for hypertension, hyperlipidemia and significant lymphedema affecting her lower extremities bilaterally who presents with 1 day history of chest pain, substernal, intermittent, worse with ambulation, nonradiating.  Patient denies any history of PEs or DVTs.  Patient denies any dizziness or loss of consciousness.  She was seen by vascular surgery in the ER earlier for concerns of phlegmasia but there was no evidence of arterial compromise.  Patient has a history of noncompliance and is not taking any medications at home.  Left leg Doppler done at [facility] revealed extensive left lower extremity DVT involving her left iliac veins, common femoral vein, and popliteal veins.  Patient admitted for acute left lower extremity DVT.  10/09/2018: Patient seen and examined at her bedside.  Admits that she has been living a very sedentary lifestyle.  She is from [place] and lives here alone.  No family members in the area.  Moved here a year ago.    She admits to persistent dyspnea at rest.  Case manager consulted to assist with medications.  Patient has no health insurance coverage.    Assessment/Plan: Active Problems:   DVT (deep venous thrombosis) (HCC)  Acute extensive left lower extremity DVT Continue heparin drip Case manager consulted to assist with medications Patient has no health insurance coverage Vascular surgery was consulted and saw the patient.  No plan for any procedures at this time.  Chronic microcytic anemia Obtain iron studies Start ferrous sulfate 325 twice daily Hemoglobin dropped from 7.8-6.9 No sign of overt bleeding Obtain FOBT Continue to monitor and repeat CBC in the morning  Significant chronic lower extremity lymphedema No apparent acute  issues  Ambulatory dysfunction secondary to bilateral knee osteoarthritis Uses a cane at baseline Fall precautions PT to assess  Hypertension Blood pressure is stable Prior to hospitalization was not taking any medications  History of noncompliance Needs reinforcement  Severe morbid obesity BMI 74 Recommend weight loss outpatient   Code Status: Full code  Family Communication: None at bedside  Disposition Plan: Home in 1 to 2 days when tolerating oral anticoagulation   Consultants:  Vascular surgery  Procedures:  None  Antimicrobials:  None  DVT prophylaxis: Heparin drip   Objective: Vitals:   10/08/18 2113 10/09/18 0000 10/09/18 0338 10/09/18 1105  BP: 136/66 (!) 141/77 138/80 (!) 143/74  Pulse: (!) 104  96 93  Resp:  15 20 (!) 26  Temp: 98.6 F (37 C) 99.6 F (37.6 C) 98.9 F (37.2 C)   TempSrc:  Oral Oral   SpO2:  94% 97% 96%  Weight:      Height:       No intake or output data in the 24 hours ending 10/09/18 1642 Filed Weights   10/08/18 1317  Weight: (!) 204.1 kg    Exam:  . General: 52 y.o. year-old female morbidly obese appears uncomfortable due to dyspnea at rest.  Alert and oriented x3. . Cardiovascular: Regular rate and rhythm with no rubs or gallops.  No thyromegaly or JVD noted.   Marland Kitchen Respiratory: Clear to auscultation with no wheezes or rales. Good inspiratory effort. . Abdomen: Soft nontender nondistended with normal bowel sounds x4 quadrants. . Musculoskeletal: Significant lymphedema affecting lower extremities bilaterally. Marland Kitchen Psychiatry: Mood is appropriate for condition and setting   Data Reviewed: CBC: Recent  Labs  Lab 10/08/18 1325 10/09/18 0558 10/09/18 1049  WBC 11.7* 9.9  --   NEUTROABS 9.5*  --   --   HGB 7.8* 6.7* 6.9*  HCT 30.9* 26.6* 26.8*  MCV 66.2* 64.9*  --   PLT 365 360  --    Basic Metabolic Panel: Recent Labs  Lab 10/08/18 1325 10/09/18 0558  NA 133* 135  K 3.3* 3.3*  CL 98 97*  CO2 24 23   GLUCOSE 124* 126*  BUN 14 13  CREATININE 1.05* 0.99  CALCIUM 8.7* 8.5*  MG 2.2  --    GFR: Estimated Creatinine Clearance: 118.8 mL/min (by C-G formula based on SCr of 0.99 mg/dL). Liver Function Tests: Recent Labs  Lab 10/08/18 1325  AST 22  ALT 21  ALKPHOS 84  BILITOT 0.8  PROT 8.4*  ALBUMIN 3.3*   No results for input(s): LIPASE, AMYLASE in the last 168 hours. No results for input(s): AMMONIA in the last 168 hours. Coagulation Profile: Recent Labs  Lab 10/08/18 1327  INR 1.27   Cardiac Enzymes: Recent Labs  Lab 10/08/18 1325 10/08/18 1858 10/09/18 0001 10/09/18 0558  TROPONINI <0.03 0.06* 0.06* 0.04*   BNP (last 3 results) No results for input(s): PROBNP in the last 8760 hours. HbA1C: Recent Labs    10/08/18 1327  HGBA1C 5.4   CBG: No results for input(s): GLUCAP in the last 168 hours. Lipid Profile: No results for input(s): CHOL, HDL, LDLCALC, TRIG, CHOLHDL, LDLDIRECT in the last 72 hours. Thyroid Function Tests: No results for input(s): TSH, T4TOTAL, FREET4, T3FREE, THYROIDAB in the last 72 hours. Anemia Panel: No results for input(s): VITAMINB12, FOLATE, FERRITIN, TIBC, IRON, RETICCTPCT in the last 72 hours. Urine analysis:    Component Value Date/Time   COLORURINE AMBER (A) 10/09/2018 0752   APPEARANCEUR CLOUDY (A) 10/09/2018 0752   LABSPEC 1.020 10/09/2018 0752   PHURINE 7.0 10/09/2018 0752   GLUCOSEU NEGATIVE 10/09/2018 0752   HGBUR NEGATIVE 10/09/2018 0752   BILIRUBINUR NEGATIVE 10/09/2018 0752   KETONESUR 5 (A) 10/09/2018 0752   PROTEINUR NEGATIVE 10/09/2018 0752   NITRITE NEGATIVE 10/09/2018 0752   LEUKOCYTESUR SMALL (A) 10/09/2018 0752   Sepsis Labs: @LABRCNTIP (procalcitonin:4,lacticidven:4)  )No results found for this or any previous visit (from the past 240 hour(s)).    Studies: Nm Pulmonary Perfusion  Result Date: 10/08/2018 CLINICAL DATA:  Left lower extremity lymphedema. Chest pain. Cough. Morbid obesity. EXAM: NUCLEAR  MEDICINE VENTILATION AND PERFUSION SCAN TECHNIQUE: Perfusion images were obtained in multiple supine projections after intravenous injection of radiopharmaceutical. Patient unable to stand or lie prone. Ventilation images were attempted but could not be performed due to patient limitations. Patient unable to keep nebulizer in mouth. RADIOPHARMACEUTICALS:  4 mCi [id number] MAA-IV COMPARISON:  Chest radiograph from earlier today. FINDINGS: Ventilation: Not performed due to patient limitations. Perfusion: There are multiple moderate segmental perfusion defects in the upper lungs bilaterally (at least two in each lung). IMPRESSION: Intermediate probability for pulmonary embolism. Multiple moderate segmental perfusion defects in the upper lungs bilaterally. Ventilation imaging could not be performed due to patient limitations. These results were called by telephone at the time of interpretation on 10/08/2018 at 5:14 pm to Dr. Derwood KaplanANKIT NANAVATI , who verbally acknowledged these results. Electronically Signed   By: Delbert PhenixJason A Poff M.D.   On: 10/08/2018 17:15    Scheduled Meds: . sodium chloride   Intravenous Once  . ipratropium-albuterol  3 mL Nebulization Once  . sodium chloride flush  3 mL Intravenous Q12H  Continuous Infusions: . sodium chloride    . heparin 1,800 Units/hr (10/09/18 1503)     LOS: 1 day     Darlin Drop, MD [facility] Pager [phone]  If 7PM-7AM, please contact night-coverage [email or web address] Password [facility] 10/09/2018, 4:42 PM

## 2018-10-09 NOTE — Plan of Care (Signed)

## 2018-10-09 NOTE — Progress Notes (Signed)
ANTICOAGULATION CONSULT NOTE - Follow-Up Consult  Pharmacy Consult for Heparin Indication: DVT  Allergies  Allergen Reactions  . Codeine     Patient Measurements: Height: 5\' 5"  (165.1 cm) Weight: (!) 450 lb (204.1 kg) IBW/kg (Calculated) : 57 HEPARIN DW (KG): 111.1  Vital Signs: Temp: 98.9 F (37.2 C) (12/05 0338) Temp Source: Oral (12/05 0338) BP: 138/80 (12/05 0338) Pulse Rate: 96 (12/05 0338)  Labs: Recent Labs    10/08/18 1325 10/08/18 1327 10/08/18 1858 10/09/18 0001 10/09/18 0558  HGB 7.8*  --   --   --  6.7*  HCT 30.9*  --   --   --  26.6*  PLT 365  --   --   --  360  LABPROT  --  15.8*  --   --   --   INR  --  1.27  --   --   --   HEPARINUNFRC  --  <0.10* 0.60 0.61  --   CREATININE 1.05*  --   --   --  0.99  TROPONINI <0.03  --  0.06* 0.06* 0.04*    Estimated Creatinine Clearance: 118.8 mL/min (by C-G formula based on SCr of 0.99 mg/dL).   Medical History: Past Medical History:  Diagnosis Date  . Hyperlipidemia   . Hypertension   . Morbid obesity (HCC)   . Pneumonia      Assessment: 52 yo brought to ED with leg pain found to have acute DVT. VQ scan intermediate probability for PE. Pharmacy consulted for heparin.   Heparin level remains therapeutic this AM at 0.61 on 1800 units/hr.  Hgb from 7.8>6.7, plts wnl, no s/s bleeding per RN.    Goal of Therapy:  Heparin level 0.3-0.7 units/ml Monitor platelets by anticoagulation protocol: Yes   Plan:  Continue heparin gtt at 1800 units/hr Daily heparin level, CBC, s/s bleeding  Daylene PoseyJonathan Travion Ke, PharmD Clinical Pharmacist Please check AMION for all [facility] numbers 10/09/2018 8:13 AM

## 2018-10-10 ENCOUNTER — Encounter (HOSPITAL_COMMUNITY): Payer: Self-pay

## 2018-10-10 LAB — CBC
HCT: 32.4 % — ABNORMAL LOW (ref 36.0–46.0)
HEMOGLOBIN: 8.7 g/dL — AB (ref 12.0–15.0)
MCH: 18.3 pg — ABNORMAL LOW (ref 26.0–34.0)
MCHC: 26.9 g/dL — ABNORMAL LOW (ref 30.0–36.0)
MCV: 68.1 fL — AB (ref 80.0–100.0)
Platelets: 328 10*3/uL (ref 150–400)
RBC: 4.76 MIL/uL (ref 3.87–5.11)
RDW: 24 % — ABNORMAL HIGH (ref 11.5–15.5)
WBC: 9.3 10*3/uL (ref 4.0–10.5)
nRBC: 0.5 % — ABNORMAL HIGH (ref 0.0–0.2)

## 2018-10-10 LAB — BPAM RBC
Blood Product Expiration Date: 201912312359
Blood Product Expiration Date: 201912312359
ISSUE DATE / TIME: 201912051455
ISSUE DATE / TIME: 201912051759
Unit Type and Rh: 5100
Unit Type and Rh: 5100

## 2018-10-10 LAB — TYPE AND SCREEN
ABO/RH(D): O POS
Antibody Screen: NEGATIVE
Unit division: 0
Unit division: 0

## 2018-10-10 LAB — HEPARIN LEVEL (UNFRACTIONATED)
Heparin Unfractionated: 0.15 IU/mL — ABNORMAL LOW (ref 0.30–0.70)
Heparin Unfractionated: 0.83 IU/mL — ABNORMAL HIGH (ref 0.30–0.70)
Heparin Unfractionated: 0.66 IU/mL (ref 0.30–0.70)

## 2018-10-10 MED ORDER — HEPARIN BOLUS VIA INFUSION
2500.0000 [IU] | Freq: Once | INTRAVENOUS | Status: AC
  Administered 2018-10-10: 2500 [IU] via INTRAVENOUS
  Filled 2018-10-10: qty 2500

## 2018-10-10 MED ORDER — ACETAMINOPHEN 325 MG PO TABS: 650.0000 mg | ORAL_TABLET | Freq: Four times a day (QID) | ORAL | Status: DC | PRN

## 2018-10-10 NOTE — Progress Notes (Signed)
ANTICOAGULATION CONSULT NOTE - Follow-Up Consult  Pharmacy Consult for Heparin Indication: DVT  Allergies  Allergen Reactions  . Codeine     Patient Measurements: Height: 5\' 5"  (165.1 cm) Weight: (!) 450 lb (204.1 kg) IBW/kg (Calculated) : 57 HEPARIN DW (KG): 111.1  Vital Signs: Temp: 99 F (37.2 C) (12/06 1249) Temp Source: Oral (12/06 1249) BP: 132/78 (12/06 1249) Pulse Rate: 84 (12/06 1249)  Labs: Recent Labs    10/08/18 1325  10/08/18 1327 10/08/18 1858 10/09/18 0001 10/09/18 0558 10/09/18 1049 10/10/18 0336 10/10/18 1350  HGB 7.8*  --   --   --   --  6.7* 6.9* 8.7*  --   HCT 30.9*  --   --   --   --  26.6* 26.8* 32.4*  --   PLT 365  --   --   --   --  360  --  328  --   LABPROT  --   --  15.8*  --   --   --   --   --   --   INR  --   --  1.27  --   --   --   --   --   --   HEPARINUNFRC  --    < > <0.10* 0.60 0.61  --   --  0.15* 0.83*  CREATININE 1.05*  --   --   --   --  0.99  --   --   --   TROPONINI <0.03  --   --  0.06* 0.06* 0.04*  --   --   --    < > = values in this interval not displayed.    Estimated Creatinine Clearance: 118.8 mL/min (by C-G formula based on SCr of 0.99 mg/dL).   Medical History: Past Medical History:  Diagnosis Date  . Hyperlipidemia   . Hypertension   . Morbid obesity (HCC)   . Pneumonia      Assessment: 52 yo brought to ED with leg pain found to have acute DVT. VQ scan intermediate probability for PE. Pharmacy consulted for heparin.   Heparin level supratherapeutic at 0.83 on 2100 units/hr.  No s/s bleeding per RN.    Goal of Therapy:  Heparin level 0.3-0.7 units/ml Monitor platelets by anticoagulation protocol: Yes   Plan:  Decrease heparin gtt at 1900 units/hr Recheck HL in 8 hours Daily heparin level, CBC, s/s bleeding  Jeanella Caraathy La Dibella, PharmD, [facility] Clinical Pharmacist Please see AMION for all Pharmacists' Contact Phone Numbers 10/10/2018, 3:03 PM

## 2018-10-10 NOTE — Progress Notes (Signed)
ANTICOAGULATION CONSULT NOTE  Pharmacy Consult for Heparin Indication: DVT  Allergies  Allergen Reactions  . Codeine     Patient Measurements: Height: 5\' 5"  (165.1 cm) Weight: (!) 450 lb (204.1 kg) IBW/kg (Calculated) : 57 HEPARIN DW (KG): 111.1  Vital Signs: Temp: 98.6 F (37 C) (12/06 0401) Temp Source: Oral (12/06 0401) BP: 130/78 (12/06 0401) Pulse Rate: 86 (12/06 0401)  Labs: Recent Labs    10/08/18 1325  10/08/18 1327 10/08/18 1858 10/09/18 0001 10/09/18 0558 10/09/18 1049 10/10/18 0336  HGB 7.8*  --   --   --   --  6.7* 6.9*  --   HCT 30.9*  --   --   --   --  26.6* 26.8*  --   PLT 365  --   --   --   --  360  --   --   LABPROT  --   --  15.8*  --   --   --   --   --   INR  --   --  1.27  --   --   --   --   --   HEPARINUNFRC  --    < > <0.10* 0.60 0.61  --   --  0.15*  CREATININE 1.05*  --   --   --   --  0.99  --   --   TROPONINI <0.03  --   --  0.06* 0.06* 0.04*  --   --    < > = values in this interval not displayed.    Estimated Creatinine Clearance: 118.8 mL/min (by C-G formula based on SCr of 0.99 mg/dL).  Assessment: 52 y.o. female with DVT for heparin    Goal of Therapy:  Heparin level 0.3-0.7 units/ml Monitor platelets by anticoagulation protocol: Yes   Plan:  Heparin 2500 units IV bolus, then increase heparin 2100 units/hr Check heparin level in 6 hours.   10/10/2018 5:18 AM

## 2018-10-10 NOTE — Progress Notes (Signed)
[place] TEAM 1 - Stepdown/ICU TEAM  Victoria Cummings  ZOX:[id number] DOB: 06/30/1966 DOA: 10/08/2018 PCP: Patient, No Pcp Per    Brief Narrative:  52 y.o.female w/ a hx of hypertension, hyperlipidemia and significant lymphedema affecting her lower extremities bilaterally who presented with 1 day of substernal chest pain. [facility]ppler at [facility] revealed an extensive left lower extremity DVT involving her iliac veins, common femoral vein, and popliteal vein.  Patient admitted for acute left lower extremity DVT.  Subjective: Reports her cp has resolved. Denies sob. No abdom pain. Pain in L leg persists w/o signif change.   Assessment & Plan:  Acute extensive left lower extremity DVT Continue heparin drip - has no health insurance coverage - Vascular Surgery has no plan for procedures at this time  Chest pain - Clinically suspected PE VQ was intermediate prob for PE - body habitus will not allow CTangio - in this clinical setting the index of suspicion for PE is quite high - will tx empirically as a PE - given extensive confirmed LE DVT, and probable PE, in the setting of severe morbid obesity, I feel lifelong anticoag will likely be required - currently on heparin while we make attempts to find a way for her to afford a DOAC  Chronic microcytic anemia No sign of overt bleeding but has required 2U PRBC this admit - f/u CBC in serial fashion - check Fe studies   Chronic lower extremity lymphedema No apparent acute issues  Ambulatory dysfunction secondary to bilateral knee osteoarthritis Uses a cane at baseline - PT/OT   Hypertension BP controlled   History of noncompliance Needs reinforcement as well as signif financial assistance   Severe morbid obesity - Body mass index is 74.88 kg/m.  DVT prophylaxis: IV heparin  Code Status: FULL CODE Family Communication: no family present at time of exam  Disposition Plan: dispo will be quiet difficult - pt needs a PCP  as well as affordable access to a DOAC  Consultants:  Vasc Surgery   Antimicrobials:  none   Objective: Blood pressure 132/78, pulse 84, temperature 99 F (37.2 C), temperature source Oral, resp. rate 20, height 5\' 5"  (1.651 m), weight (!) 204.1 kg, last menstrual period 06/08/2018, SpO2 98 %.  Intake/Output Summary (Last 24 hours) at 10/10/2018 1610 Last data filed at 10/09/2018 2104 Gross per 24 hour  Intake 315 ml  Output -  Net 315 ml   Filed Weights   10/08/18 1317  Weight: (!) 204.1 kg    Examination: General: No acute respiratory distress Lungs: Clear to auscultation bilaterally without wheezes or crackles - very distant BS Cardiovascular: Regular rate and rhythm without murmur - very distant HS  Abdomen: Nontender, soft, bowel sounds positive, morbidly obese  Extremities: marked obesity and lymphedema B LE   CBC: Recent Labs  Lab 10/08/18 1325 10/09/18 0558 10/09/18 1049 10/10/18 0336  WBC 11.7* 9.9  --  9.3  NEUTROABS 9.5*  --   --   --   HGB 7.8* 6.7* 6.9* 8.7*  HCT 30.9* 26.6* 26.8* 32.4*  MCV 66.2* 64.9*  --  68.1*  PLT 365 360  --  328   Basic Metabolic Panel: Recent Labs  Lab 10/08/18 1325 10/09/18 0558  NA 133* 135  K 3.3* 3.3*  CL 98 97*  CO2 24 23  GLUCOSE 124* 126*  BUN 14 13  CREATININE 1.05* 0.99  CALCIUM 8.7* 8.5*  MG 2.2  --    GFR: Estimated Creatinine Clearance:  118.8 mL/min (by C-G formula based on SCr of 0.99 mg/dL).  Liver Function Tests: Recent Labs  Lab 10/08/18 1325  AST 22  ALT 21  ALKPHOS 84  BILITOT 0.8  PROT 8.4*  ALBUMIN 3.3*    Coagulation Profile: Recent Labs  Lab 10/08/18 1327  INR 1.27    Cardiac Enzymes: Recent Labs  Lab 10/08/18 1325 10/08/18 1858 10/09/18 0001 10/09/18 0558  TROPONINI <0.03 0.06* 0.06* 0.04*    HbA1C: Hgb A1c MFr Bld  Date/Time Value Ref Range Status  10/08/2018 01:27 PM 5.4 4.8 - 5.6 % Final    Comment:    (NOTE) Pre diabetes:          5.7%-6.4% Diabetes:               >6.4% Glycemic control for   <7.0% adults with diabetes     Scheduled Meds: . sodium chloride   Intravenous Once  . ipratropium-albuterol  3 mL Nebulization Once  . sodium chloride flush  3 mL Intravenous Q12H   Continuous Infusions: . sodium chloride    . heparin 2,100 Units/hr (10/10/18 0757)     LOS: 2 days   Lonia Blood, MD [facility] Office  [phone] Pager - Text Page per Amion  If 7PM-7AM, please contact night-coverage per Amion 10/10/2018, 4:10 PM

## 2018-10-11 DIAGNOSIS — D649 Anemia, unspecified: Secondary | ICD-10-CM

## 2018-10-11 DIAGNOSIS — I2699 Other pulmonary embolism without acute cor pulmonale: Secondary | ICD-10-CM

## 2018-10-11 LAB — CBC
HCT: 32.5 % — ABNORMAL LOW (ref 36.0–46.0)
Hemoglobin: 8.6 g/dL — ABNORMAL LOW (ref 12.0–15.0)
MCH: 18.1 pg — AB (ref 26.0–34.0)
MCHC: 26.5 g/dL — ABNORMAL LOW (ref 30.0–36.0)
MCV: 68.3 fL — AB (ref 80.0–100.0)
Platelets: 355 10*3/uL (ref 150–400)
RBC: 4.76 MIL/uL (ref 3.87–5.11)
RDW: 24.1 % — ABNORMAL HIGH (ref 11.5–15.5)
WBC: 9.8 10*3/uL (ref 4.0–10.5)
nRBC: 0.3 % — ABNORMAL HIGH (ref 0.0–0.2)

## 2018-10-11 LAB — RETICULOCYTES
Immature Retic Fract: 41 % — ABNORMAL HIGH (ref 2.3–15.9)
RBC.: 4.76 MIL/uL (ref 3.87–5.11)
RETIC COUNT ABSOLUTE: 75.2 10*3/uL (ref 19.0–186.0)
Retic Ct Pct: 1.6 % (ref 0.4–3.1)

## 2018-10-11 LAB — COMPREHENSIVE METABOLIC PANEL
ALT: 20 U/L (ref 0–44)
AST: 20 U/L (ref 15–41)
Albumin: 2.6 g/dL — ABNORMAL LOW (ref 3.5–5.0)
Alkaline Phosphatase: 70 U/L (ref 38–126)
Anion gap: 11 (ref 5–15)
BUN: 11 mg/dL (ref 6–20)
CO2: 25 mmol/L (ref 22–32)
Calcium: 8.7 mg/dL — ABNORMAL LOW (ref 8.9–10.3)
Chloride: 98 mmol/L (ref 98–111)
Creatinine, Ser: 1.1 mg/dL — ABNORMAL HIGH (ref 0.44–1.00)
GFR calc Af Amer: >60 mL/min (ref >60)
GFR calc non Af Amer: 57 mL/min — ABNORMAL LOW (ref >60)
Glucose, Bld: 124 mg/dL — ABNORMAL HIGH (ref 70–99)
Potassium: 3.5 mmol/L (ref 3.5–5.1)
Sodium: 134 mmol/L — ABNORMAL LOW (ref 135–145)
Total Bilirubin: 0.6 mg/dL (ref 0.3–1.2)
Total Protein: 7 g/dL (ref 6.5–8.1)

## 2018-10-11 LAB — IRON AND TIBC
IRON: 21 ug/dL — AB (ref 28–170)
Saturation Ratios: 6 % — ABNORMAL LOW (ref 10.4–31.8)
TIBC: 323 ug/dL (ref 250–450)
UIBC: 302 ug/dL

## 2018-10-11 LAB — FERRITIN: Ferritin: 22 ng/mL (ref 11–307)

## 2018-10-11 LAB — FOLATE: Folate: 7.2 ng/mL (ref >5.9)

## 2018-10-11 LAB — VITAMIN B12: Vitamin B-12: 198 pg/mL (ref 180–914)

## 2018-10-11 LAB — HEPARIN LEVEL (UNFRACTIONATED): Heparin Unfractionated: 0.58 IU/mL (ref 0.30–0.70)

## 2018-10-11 MED ORDER — WARFARIN SODIUM 10 MG PO TABS
10.0000 mg | ORAL_TABLET | Freq: Once | ORAL | Status: AC
  Administered 2018-10-11: 10 mg via ORAL
  Filled 2018-10-11: qty 1

## 2018-10-11 MED ORDER — WARFARIN VIDEO: Freq: Once | Status: DC

## 2018-10-11 MED ORDER — COUMADIN BOOK
Freq: Once | Status: DC
  Filled 2018-10-11: qty 1

## 2018-10-11 MED ORDER — WARFARIN - PHARMACIST DOSING INPATIENT: Freq: Every day | Status: DC

## 2018-10-11 NOTE — Progress Notes (Signed)
PROGRESS NOTE    Victoria Cummings  ZOX:[id number] DOB: 1966/06/15 DOA: 10/08/2018 PCP: Patient, No Pcp Per  Brief Narrative: 52 y.o.female w/ a hx of hypertension, hyperlipidemia andsignificant lymphedema affecting her lower extremities bilaterally who presentedwith 1 day of substernal chest pain. [facility] involving[facility] iliac veins, common femoral vein, and popliteal vein.Patient admitted for acute left lower extremity DVT.   Assessment & Plan:   Active Problems:   DVT (deep venous thrombosis) (HCC)  Acute extensive left lower extremity DVT-continue heparin will consult pharmacy to start Coumadin.  Patient reports she has no health insurance and will not be able to afford newer agents.  Chest pain - Clinically suspected PE VQ was intermediate prob for PE - body habitus will not allow CTangio - in this clinical setting the index of suspicion for PE is quite high - will tx empirically as a PE - given extensive confirmed LE DVT, and probable PE, in the setting of severe morbid obesity, I feel lifelong anticoag will likely be required  Chronic microcytic anemia No sign of overt bleeding but has required 2U PRBC this admiT  Chronic lower extremity lymphedema No apparent acute issues  Ambulatory dysfunction secondary to bilateral knee osteoarthritis Uses a cane at baseline - PT/OT   Hypertension BP controlled   History of noncompliance Needs reinforcement as well as signif financial assistance      Estimated body mass index is 74.88 kg/m as calculated from the following:   Height as of this encounter: 5\' 5"  (1.651 m).   Weight as of this encounter: 204.1 kg.  DVT prophylaxis: Heparin/Coumadin Code Status: Full code Family Communication: None Disposition Plan: Starting Coumadin needs to be therapeutic prior to discharge due to extensive DVT and high probability for pulmonary  embolism   Consultants vascular  Procedures: None Antimicrobials: None  Subjective: Resting in bed reports she does not walk much at home has chronic lower extremity lymphedema but worked in May 2018 denies any chest pain has dyspnea on exertion  Objective: Vitals:   10/10/18 1249 10/10/18 1705 10/10/18 1911 10/11/18 0518  BP: 132/78 122/72 140/78 (!) 115/98  Pulse: 84 85 88 84  Resp:    13  Temp: 99 F (37.2 C) 98.5 F (36.9 C) 99.3 F (37.4 C) 98.3 F (36.8 C)  TempSrc: Oral Oral Oral Oral  SpO2: 98% 99% 100% 99%  Weight:      Height:        Intake/Output Summary (Last 24 hours) at 10/11/2018 1326 Last data filed at 10/11/2018 0600 Gross per 24 hour  Intake 1197.5 ml  Output 800 ml  Net 397.5 ml   Filed Weights   10/08/18 1317  Weight: (!) 204.1 kg    Examination:  General exam: Appears calm and comfortable  Respiratory system: Clear to auscultation. Respiratory effort normal. Cardiovascular system: S1 & S2 heard, RRR. No JVD, murmurs, rubs, gallops or clicks. No pedal edema. Gastrointestinal system: Abdomen is nondistended, soft and nontender. No organomegaly or masses felt. Normal bowel sounds heard. Central nervous system: Alert and oriented. No focal neurological deficits. Extremities: Lymphedema chronic Skin: No rashes, lesions or ulcers Psychiatry: Judgement and insight appear normal. Mood & affect appropriate.     Data Reviewed: I have personally reviewed following labs and imaging studies  CBC: Recent Labs  Lab 10/08/18 1325 10/09/18 0558 10/09/18 1049 10/10/18 0336 10/11/18 0452  WBC 11.7* 9.9  --  9.3 9.8  NEUTROABS 9.5*  --   --   --   --  HGB 7.8* 6.7* 6.9* 8.7* 8.6*  HCT 30.9* 26.6* 26.8* 32.4* 32.5*  MCV 66.2* 64.9*  --  68.1* 68.3*  PLT 365 360  --  328 355   Basic Metabolic Panel: Recent Labs  Lab 10/08/18 1325 10/09/18 0558 10/11/18 0452  NA 133* 135 134*  K 3.3* 3.3* 3.5  CL 98 97* 98  CO2 24 23 25   GLUCOSE 124* 126*  124*  BUN 14 13 11   CREATININE 1.05* 0.99 1.10*  CALCIUM 8.7* 8.5* 8.7*  MG 2.2  --   --    GFR: Estimated Creatinine Clearance: 106.9 mL/min (A) (by C-G formula based on SCr of 1.1 mg/dL (H)). Liver Function Tests: Recent Labs  Lab 10/08/18 1325 10/11/18 0452  AST 22 20  ALT 21 20  ALKPHOS 84 70  BILITOT 0.8 0.6  PROT 8.4* 7.0  ALBUMIN 3.3* 2.6*   No results for input(s): LIPASE, AMYLASE in the last 168 hours. No results for input(s): AMMONIA in the last 168 hours. Coagulation Profile: Recent Labs  Lab 10/08/18 1327  INR 1.27   Cardiac Enzymes: Recent Labs  Lab 10/08/18 1325 10/08/18 1858 10/09/18 0001 10/09/18 0558  TROPONINI <0.03 0.06* 0.06* 0.04*   BNP (last 3 results) No results for input(s): PROBNP in the last 8760 hours. HbA1C: Recent Labs    10/08/18 1327  HGBA1C 5.4   CBG: No results for input(s): GLUCAP in the last 168 hours. Lipid Profile: No results for input(s): CHOL, HDL, LDLCALC, TRIG, CHOLHDL, LDLDIRECT in the last 72 hours. Thyroid Function Tests: No results for input(s): TSH, T4TOTAL, FREET4, T3FREE, THYROIDAB in the last 72 hours. Anemia Panel: Recent Labs    10/11/18 0452  VITAMINB12 198  FOLATE 7.2  FERRITIN 22  TIBC 323  IRON 21*  RETICCTPCT 1.6   Sepsis Labs: No results for input(s): PROCALCITON, LATICACIDVEN in the last 168 hours.  No results found for this or any previous visit (from the past 240 hour(s)).       Radiology Studies: No results found.      Scheduled Meds: . coumadin book   Does not apply Once  . sodium chloride flush  3 mL Intravenous Q12H  . warfarin  10 mg Oral ONCE-1800  . warfarin   Does not apply Once  . Warfarin - Pharmacist Dosing Inpatient   Does not apply q1800   Continuous Infusions: . sodium chloride    . heparin 1,900 Units/hr (10/11/18 0600)     LOS: 3 days     Alwyn RenElizabeth G Zavier Canela, MD [facility]  If 7PM-7AM, please contact  night-coverage [email or web address] Password [email or web address] 10/11/2018, 1:26 PM

## 2018-10-11 NOTE — Progress Notes (Signed)
ANTICOAGULATION CONSULT NOTE - Follow-Up Consult  Pharmacy Consult for Heparin Indication: DVT  Allergies  Allergen Reactions  . Codeine     Patient Measurements: Height: 5\' 5"  (165.1 cm) Weight: (!) 450 lb (204.1 kg) IBW/kg (Calculated) : 57 HEPARIN DW (KG): 111.1  Vital Signs: Temp: 99.3 F (37.4 C) (12/06 1911) Temp Source: Oral (12/06 1911) BP: 140/78 (12/06 1911) Pulse Rate: 88 (12/06 1911)  Labs: Recent Labs    10/08/18 1325  10/08/18 1327 10/08/18 1858 10/09/18 0001 10/09/18 0558 10/09/18 1049 10/10/18 0336 10/10/18 1350 10/10/18 2326  HGB 7.8*  --   --   --   --  6.7* 6.9* 8.7*  --   --   HCT 30.9*  --   --   --   --  26.6* 26.8* 32.4*  --   --   PLT 365  --   --   --   --  360  --  328  --   --   LABPROT  --   --  15.8*  --   --   --   --   --   --   --   INR  --   --  1.27  --   --   --   --   --   --   --   HEPARINUNFRC  --    < > <0.10* 0.60 0.61  --   --  0.15* 0.83* 0.66  CREATININE 1.05*  --   --   --   --  0.99  --   --   --   --   TROPONINI <0.03  --   --  0.06* 0.06* 0.04*  --   --   --   --    < > = values in this interval not displayed.    Estimated Creatinine Clearance: 118.8 mL/min (by C-G formula based on SCr of 0.99 mg/dL).   Medical History: Past Medical History:  Diagnosis Date  . Hyperlipidemia   . Hypertension   . Morbid obesity (HCC)   . Pneumonia      Assessment: 52 yo brought to ED with leg pain found to have acute DVT. VQ scan intermediate probability for PE. Pharmacy consulted for heparin.   Heparin level therapeutic x 1 after rate decrease  Goal of Therapy:  Heparin level 0.3-0.7 units/ml Monitor platelets by anticoagulation protocol: Yes   Plan:  Cont heparin drip at 1900 units/hr Confirmatory heparin level with AM labs  Abran DukeJames Jazariah Teall, PharmD, BCPS Clinical Pharmacist Phone: [phone]

## 2018-10-11 NOTE — Discharge Instructions (Signed)

## 2018-10-11 NOTE — Progress Notes (Addendum)
ANTICOAGULATION CONSULT NOTE - Follow-Up Consult  Pharmacy Consult for Heparin > warfarin Indication: DVT, probable PE  Allergies  Allergen Reactions  . Codeine     Patient Measurements: Height: 5\' 5"  (165.1 cm) Weight: (!) 450 lb (204.1 kg) IBW/kg (Calculated) : 57 HEPARIN DW (KG): 111.1  Vital Signs: Temp: 98.3 F (36.8 C) (12/07 0518) Temp Source: Oral (12/07 0518) BP: 115/98 (12/07 0518) Pulse Rate: 84 (12/07 0518)  Labs: Recent Labs    10/08/18 1325  10/08/18 1327 10/08/18 1858 10/09/18 0001 10/09/18 0558 10/09/18 1049 10/10/18 0336 10/10/18 1350 10/10/18 2326 10/11/18 0452  HGB 7.8*  --   --   --   --  6.7* 6.9* 8.7*  --   --  8.6*  HCT 30.9*  --   --   --   --  26.6* 26.8* 32.4*  --   --  32.5*  PLT 365  --   --   --   --  360  --  328  --   --  355  LABPROT  --   --  15.8*  --   --   --   --   --   --   --   --   INR  --   --  1.27  --   --   --   --   --   --   --   --   HEPARINUNFRC  --    < > <0.10* 0.60 0.61  --   --  0.15* 0.83* 0.66 0.58  CREATININE 1.05*  --   --   --   --  0.99  --   --   --   --  1.10*  TROPONINI <0.03  --   --  0.06* 0.06* 0.04*  --   --   --   --   --    < > = values in this interval not displayed.    Estimated Creatinine Clearance: 106.9 mL/min (A) (by C-G formula based on SCr of 1.1 mg/dL (H)).   Medical History: Past Medical History:  Diagnosis Date  . Hyperlipidemia   . Hypertension   . Morbid obesity (HCC)   . Pneumonia      Assessment: 52 yo brought to ED with leg pain found to have acute DVT. VQ scan intermediate probability for PE. Pharmacy consulted for heparin.   Heparin level remains therapeutic. CBC stable. No bleed issues documented.  Goal of Therapy:  Heparin level 0.3-0.7 units/ml Monitor platelets by anticoagulation protocol: Yes   Plan:  Continue heparin drip at 1900 units/hr Monitor daily heparin level and CBC, s/sx bleeding F/u plans for long-term anticoagulation  Babs BertinHaley Kenyada Hy, PharmD,  BCPS Clinical Pharmacist Clinical phone [phone] Please check AMION for all [facility] contact numbers 10/11/2018 10:24 AM  ADDENDUM: Pharmacy consulted to transition patient to warfarin. Baseline INR 1.27 on 12/4. Will need minimum 5 days of overlap.  Plan: Continue heparin drip at 1900 units/hr Warfarin 10mg  PO x 1 Monitor daily heparin level/INR/CBC, s/sx bleeding D/c heparin after 5 days + INR therapeutic >24hrs  Babs BertinHaley Tria Noguera, PharmD, BCPS Please check AMION for all [facility] contact numbers Clinical Pharmacist 10/11/2018 11:38 AM

## 2018-10-12 LAB — PROTIME-INR
INR: 1.22
Prothrombin Time: 15.3 seconds — ABNORMAL HIGH (ref 11.4–15.2)

## 2018-10-12 LAB — CBC
HCT: 31.8 % — ABNORMAL LOW (ref 36.0–46.0)
Hemoglobin: 8.4 g/dL — ABNORMAL LOW (ref 12.0–15.0)
MCH: 18.2 pg — ABNORMAL LOW (ref 26.0–34.0)
MCHC: 26.4 g/dL — ABNORMAL LOW (ref 30.0–36.0)
MCV: 68.8 fL — ABNORMAL LOW (ref 80.0–100.0)
Platelets: 340 10*3/uL (ref 150–400)
RBC: 4.62 MIL/uL (ref 3.87–5.11)
RDW: 24.6 % — ABNORMAL HIGH (ref 11.5–15.5)
WBC: 8.7 10*3/uL (ref 4.0–10.5)
nRBC: 0 % (ref 0.0–0.2)

## 2018-10-12 LAB — HEPARIN LEVEL (UNFRACTIONATED): Heparin Unfractionated: 0.63 IU/mL (ref 0.30–0.70)

## 2018-10-12 MED ORDER — BISACODYL 5 MG PO TBEC
10.0000 mg | DELAYED_RELEASE_TABLET | Freq: Every day | ORAL | Status: DC | PRN
  Administered 2018-10-12 – 2018-10-13 (×2): 10 mg via ORAL
  Filled 2018-10-12 (×2): qty 2

## 2018-10-12 MED ORDER — WARFARIN SODIUM 10 MG PO TABS
10.0000 mg | ORAL_TABLET | Freq: Once | ORAL | Status: AC
  Administered 2018-10-12: 10 mg via ORAL
  Filled 2018-10-12: qty 1

## 2018-10-12 MED ORDER — LACTULOSE 10 GM/15ML PO SOLN
10.0000 g | Freq: Every day | ORAL | Status: DC | PRN
  Administered 2018-10-12 – 2018-10-13 (×2): 10 g via ORAL
  Filled 2018-10-12 (×2): qty 15

## 2018-10-12 MED ORDER — POLYETHYLENE GLYCOL 3350 17 G PO PACK
17.0000 g | PACK | Freq: Every day | ORAL | Status: DC
  Administered 2018-10-13: 17 g via ORAL
  Filled 2018-10-12: qty 1

## 2018-10-12 NOTE — Progress Notes (Addendum)
PROGRESS NOTE    Victoria Cummings  ZOX:[id number] DOB: 12/05/1965 DOA: 10/08/2018 PCP: Patient, No Pcp Per    Brief Narrative:  52 y.o.femalew/ a hx ofhypertension, hyperlipidemia andsignificant lymphedema affecting her lower extremities bilaterally who presentedwith 1 day ofsubsternalchest pain. [facility] involvingher iliac veins, common femoral vein, and popliteal vein.Patient admitted for acute left lower extremity DVT.     Assessment & Plan:   Active Problems:   DVT (deep venous thrombosis) (HCC)   Acute pulmonary embolism (HCC)   Anemia  Acute extensive left lower extremity DVT-continue heparin and Coumadin.  Patient reports she has no health insurance and will not be able to afford newer agents.  INR is 1.21 to continue heparin and Coumadin plan discharge once INR is above 2.0.  Chest pain- Clinically suspected PE VQ was intermediate prob for PE- body habitus will not allow CTangio - in this clinical setting the index of suspicion for PE is quite high - will tx empirically as a PE - given extensive confirmed LE DVT, and probable PE, in the setting of severe morbid obesity, I feel lifelong anticoag will likely be required  Chronic microcytic anemia No sign of overt bleedingbut has required 2U PRBC this admiT  Chronic lower extremity lymphedema No apparent acute issues  Ambulatory dysfunction secondary to bilateral knee osteoarthritis Uses a cane at baseline- PT/OT  Hypertension BP controlled  History of noncompliance Needs reinforcementas well as signif financial assistance     Nutrition Interventions:     Estimated body mass index is 74.88 kg/m as calculated from the following:   Height as of this encounter: 5\' 5"  (1.651 m).   Weight as of this encounter: 204.1 kg.  DVT prophylaxis: Heparin and Coumadin Code Status full code Family Communication: Pending  therapeutic INR Disposition Plan: None Consultants: None   Procedures: None Antimicrobials: None  Subjective: Resting in bed in no acute distress denies chest pain shortness of breath or cough  Objective: Vitals:   10/11/18 1339 10/11/18 1951 10/12/18 0350 10/12/18 1114  BP: 129/82 122/65 118/75 132/75  Pulse:  85 85 80  Resp: 15 (!) 22 19 18   Temp: 98.4 F (36.9 C) 98.8 F (37.1 C) 98.3 F (36.8 C)   TempSrc: Oral Oral Oral   SpO2:  99% 95% 96%  Weight:      Height:        Intake/Output Summary (Last 24 hours) at 10/12/2018 1431 Last data filed at 10/12/2018 1100 Gross per 24 hour  Intake 516.9 ml  Output 1300 ml  Net -783.1 ml   Filed Weights   10/08/18 1317  Weight: (!) 204.1 kg    Examination:  General exam: Appears calm and comfortable  Respiratory system: Clear to auscultation. Respiratory effort normal. Cardiovascular system: S1 & S2 heard, RRR. No JVD, murmurs, rubs, gallops or clicks. No pedal edema. Gastrointestinal system: Abdomen is nondistended, soft and nontender. No organomegaly or masses felt. Normal bowel sounds heard. Central nervous system: Alert and oriented. No focal neurological deficits. Extremities: Chronic lymphatic edema Skin: No rashes, lesions or ulcers Psychiatry: Judgement and insight appear normal. Mood & affect appropriate.     Data Reviewed: I have personally reviewed following labs and imaging studies  CBC: Recent Labs  Lab 10/08/18 1325 10/09/18 0558 10/09/18 1049 10/10/18 0336 10/11/18 0452 10/12/18 0341  WBC 11.7* 9.9  --  9.3 9.8 8.7  NEUTROABS 9.5*  --   --   --   --   --  HGB 7.8* 6.7* 6.9* 8.7* 8.6* 8.4*  HCT 30.9* 26.6* 26.8* 32.4* 32.5* 31.8*  MCV 66.2* 64.9*  --  68.1* 68.3* 68.8*  PLT 365 360  --  328 355 340   Basic Metabolic Panel: Recent Labs  Lab 10/08/18 1325 10/09/18 0558 10/11/18 0452  NA 133* 135 134*  K 3.3* 3.3* 3.5  CL 98 97* 98  CO2 24 23 25   GLUCOSE 124* 126* 124*  BUN 14 13 11     CREATININE 1.05* 0.99 1.10*  CALCIUM 8.7* 8.5* 8.7*  MG 2.2  --   --    GFR: Estimated Creatinine Clearance: 106.9 mL/min (A) (by C-G formula based on SCr of 1.1 mg/dL (H)). Liver Function Tests: Recent Labs  Lab 10/08/18 1325 10/11/18 0452  AST 22 20  ALT 21 20  ALKPHOS 84 70  BILITOT 0.8 0.6  PROT 8.4* 7.0  ALBUMIN 3.3* 2.6*   No results for input(s): LIPASE, AMYLASE in the last 168 hours. No results for input(s): AMMONIA in the last 168 hours. Coagulation Profile: Recent Labs  Lab 10/08/18 1327 10/12/18 0341  INR 1.27 1.22   Cardiac Enzymes: Recent Labs  Lab 10/08/18 1325 10/08/18 1858 10/09/18 0001 10/09/18 0558  TROPONINI <0.03 0.06* 0.06* 0.04*   BNP (last 3 results) No results for input(s): PROBNP in the last 8760 hours. HbA1C: No results for input(s): HGBA1C in the last 72 hours. CBG: No results for input(s): GLUCAP in the last 168 hours. Lipid Profile: No results for input(s): CHOL, HDL, LDLCALC, TRIG, CHOLHDL, LDLDIRECT in the last 72 hours. Thyroid Function Tests: No results for input(s): TSH, T4TOTAL, FREET4, T3FREE, THYROIDAB in the last 72 hours. Anemia Panel: Recent Labs    10/11/18 0452  VITAMINB12 198  FOLATE 7.2  FERRITIN 22  TIBC 323  IRON 21*  RETICCTPCT 1.6   Sepsis Labs: No results for input(s): PROCALCITON, LATICACIDVEN in the last 168 hours.  No results found for this or any previous visit (from the past 240 hour(s)).       Radiology Studies: No results found.      Scheduled Meds: . coumadin book   Does not apply Once  . sodium chloride flush  3 mL Intravenous Q12H  . warfarin  10 mg Oral ONCE-1800  . warfarin   Does not apply Once  . Warfarin - Pharmacist Dosing Inpatient   Does not apply q1800   Continuous Infusions: . sodium chloride    . heparin 1,900 Units/hr (10/12/18 0720)     LOS: 4 days     Alwyn Ren, MD [facility]  If 7PM-7AM, please contact  night-coverage [email or web address] Password [facility] 10/12/2018, 2:31 PM

## 2018-10-12 NOTE — Progress Notes (Signed)
ANTICOAGULATION CONSULT NOTE - Follow-Up Consult  Pharmacy Consult for Heparin > warfarin Indication: DVT, probable PE  Allergies  Allergen Reactions  . Codeine     Patient Measurements: Height: 5\' 5"  (165.1 cm) Weight: (!) 450 lb (204.1 kg) IBW/kg (Calculated) : 57 HEPARIN DW (KG): 111.1  Vital Signs: Temp: 98.3 F (36.8 C) (12/08 0350) Temp Source: Oral (12/08 0350) BP: 132/75 (12/08 1114) Pulse Rate: 80 (12/08 1114)  Labs: Recent Labs    10/10/18 0336  10/10/18 2326 10/11/18 0452 10/12/18 0338 10/12/18 0341  HGB 8.7*  --   --  8.6*  --  8.4*  HCT 32.4*  --   --  32.5*  --  31.8*  PLT 328  --   --  355  --  340  LABPROT  --   --   --   --   --  15.3*  INR  --   --   --   --   --  1.22  HEPARINUNFRC 0.15*   < > 0.66 0.58 0.63  --   CREATININE  --   --   --  1.10*  --   --    < > = values in this interval not displayed.    Estimated Creatinine Clearance: 106.9 mL/min (A) (by C-G formula based on SCr of 1.1 mg/dL (H)).   Medical History: Past Medical History:  Diagnosis Date  . Hyperlipidemia   . Hypertension   . Morbid obesity (HCC)   . Pneumonia      Assessment: 52 yo brought to ED with leg pain found to have acute DVT. VQ scan intermediate probability for PE. Pharmacy consulted for heparin and warfarin dosing started 12/7. Will need at least 5 days of overlap - day #2/5.  Heparin level remains therapeutic. INR 1.22 stable. CBC stable. No bleed issues documented.  Goal of Therapy:  Heparin level 0.3-0.7 units/ml Monitor platelets by anticoagulation protocol: Yes   Plan:  Continue heparin drip at 1900 units/hr Warfarin 10mg  PO x 1 Monitor daily heparin level/INR/CBC, s/sx bleeding D/c heparin after 5 days + INR therapeutic >24hrs  Babs BertinHaley Anecia Nusbaum, PharmD, BCPS Clinical Pharmacist Clinical phone [phone] Please check AMION for all [facility] contact numbers 10/12/2018 11:18 AM

## 2018-10-13 LAB — PROTIME-INR
INR: 1.21
Prothrombin Time: 15.2 seconds (ref 11.4–15.2)

## 2018-10-13 LAB — CBC
HCT: 31.7 % — ABNORMAL LOW (ref 36.0–46.0)
Hemoglobin: 8.5 g/dL — ABNORMAL LOW (ref 12.0–15.0)
MCH: 18.5 pg — AB (ref 26.0–34.0)
MCHC: 26.8 g/dL — ABNORMAL LOW (ref 30.0–36.0)
MCV: 68.9 fL — ABNORMAL LOW (ref 80.0–100.0)
PLATELETS: 357 10*3/uL (ref 150–400)
RBC: 4.6 MIL/uL (ref 3.87–5.11)
RDW: 24.9 % — ABNORMAL HIGH (ref 11.5–15.5)
WBC: 8.5 10*3/uL (ref 4.0–10.5)
nRBC: 0 % (ref 0.0–0.2)

## 2018-10-13 LAB — HEPARIN LEVEL (UNFRACTIONATED): Heparin Unfractionated: 0.57 IU/mL (ref 0.30–0.70)

## 2018-10-13 MED ORDER — BISACODYL 10 MG RE SUPP: 10.0000 mg | Freq: Every day | RECTAL | Status: DC | PRN

## 2018-10-13 MED ORDER — WARFARIN SODIUM 2.5 MG PO TABS
12.5000 mg | ORAL_TABLET | Freq: Once | ORAL | Status: AC
  Administered 2018-10-13: 12.5 mg via ORAL
  Filled 2018-10-13: qty 1

## 2018-10-13 NOTE — Care Management Note (Signed)
Case Management Note Donn PieriniKristi Grayer Sproles RN, BSN Transitions of Care Unit 4E- RN Case Manager [phone]  Patient Details  Name: Victoria Cummings MRN: [id number] Date of Birth: 06/06/1966  Subjective/Objective:    Pt admitted with DVT                Action/Plan: PTA pt lived at home (motel) alone, CM spoke with pt at bedside for transition of care needs- per pt she uses cane and drives occasionally- pt reports that she will need transportation assistance at discharge as she does not have any family or anyone else to take her home. Will consult CSW for transport issues. Pt also is currently on 02- will need to assess for home 02 needs- pt is uninsured and states she was not on any 02 prior to admission. Pt does not have a local PCP- [place]. Pt will need a PCP for INR checks- [place] clinics [place] to see if an appointment is available for INR needs, discussed with pt cost for coumadin and TOC pharmacy for discharge needs- pt reports that [facility]. - CM to continue to follow for transition of care needs and potential MATCH needs.    Expected Discharge Date:                  Expected Discharge Plan:  Home/Self Care  In-House Referral:     Discharge planning Services  CM Consult, Indigent Health Clinic, Medication Assistance  Post Acute Care Choice:  Durable Medical Equipment Choice offered to:     DME Arranged:    DME Agency:     HH Arranged:    HH Agency:     Status of Service:  In process, will continue to follow  If discussed at Long Length of Stay Meetings, dates discussed:    Discharge Disposition:   Additional Comments:  Darrold SpanWebster, Kyan Yurkovich Hall, RN 10/13/2018, 3:44 PM

## 2018-10-13 NOTE — Progress Notes (Signed)
Ambulated to toilet and back (approx 20') with cane and 1 person standby.   Large BM x 1

## 2018-10-13 NOTE — Progress Notes (Signed)
PROGRESS NOTE    Dannielle Huhnnette Wacha  ZOX:[id number] DOB: 11/18/1965 DOA: 10/08/2018 PCP: Patient, No Pcp Per  Brief Narrative:52 y.o.femalew/ a hx ofhypertension, hyperlipidemia andsignificant lymphedema affecting her lower extremities bilaterally who presentedwith 1 day ofsubsternalchest pain. [facility] involving[facility] iliac veins, common femoral vein, and popliteal vein.Patient admitted for acute left lower extremity DVT.    Assessment & Plan:   Active Problems:   DVT (deep venous thrombosis) (HCC)   Acute pulmonary embolism (HCC)   Anemia  Acute extensive left lower extremity DVT-continue heparin will consult pharmacy to start Coumadin. Patient reports she has no health insurance and will not be able to afford newer agents.  INR is 1.2 to continue heparin and Coumadin plan discharge once INR is above 2.0.  Chest pain- Clinically suspected PE VQ was intermediate prob for PE- body habitus will not allow CTangio - in this clinical setting the index of suspicion for PE is quite high - will tx empirically as a PE - given extensive confirmed LE DVT, and probable PE, in the setting of severe morbid obesity, I feel lifelong anticoag will likely be required  Chronic microcytic anemia No sign of overt bleedingbut has required 2U PRBC this admiT  Chronic lower extremity lymphedema No apparent acute issues  Ambulatory dysfunction secondary to bilateral knee osteoarthritis Uses a cane at baseline- PT/OT  Hypertension BP controlled  History of noncompliance Needs reinforcementas well as signif financial assistance     Estimated body mass index is 74.88 kg/m as calculated from the following:   Height as of this encounter: 5\' 5"  (1.651 m).   Weight as of this encounter: 204.1 kg.     DVT prophylaxis Heparin and Coumadin Code Status: Full code Family Communication: She has no  family Disposition Plan: Await therapeutic INR  Consultants: None  Procedures: None Antimicrobials: None  Subjective: Resting in bed in no acute distress   Objective: Vitals:   10/12/18 1114 10/12/18 1910 10/13/18 0331 10/13/18 1313  BP: 132/75 125/70 120/70 129/72  Pulse: 80 83 81 79  Resp: 18 17 19 20   Temp:  98.2 F (36.8 C) 98.3 F (36.8 C) 98.2 F (36.8 C)  TempSrc:  Oral Oral Oral  SpO2: 96% 100% 92% 100%  Weight:      Height:        Intake/Output Summary (Last 24 hours) at 10/13/2018 1352 Last data filed at 10/13/2018 1300 Gross per 24 hour  Intake 977.34 ml  Output 1000 ml  Net -22.66 ml   Filed Weights   10/08/18 1317  Weight: (!) 204.1 kg    Examination:  General exam: Appears calm and comfortable  Respiratory system: Clear to auscultation. Respiratory effort normal. Cardiovascular system: S1 & S2 heard, RRR. No JVD, murmurs, rubs, gallops or clicks. No pedal edema. Gastrointestinal system: Abdomen is nondistended, soft and nontender. No organomegaly or masses felt. Normal bowel sounds heard. Central nervous system: Alert and oriented. No focal neurological deficits. Extremities: Symmetric 5 x 5 power. Skin: No rashes, lesions or ulcers Psychiatry: Judgement and insight appear normal. Mood & affect appropriate.     Data Reviewed: I have personally reviewed following labs and imaging studies  CBC: Recent Labs  Lab 10/08/18 1325 10/09/18 0558 10/09/18 1049 10/10/18 0336 10/11/18 0452 10/12/18 0341 10/13/18 0334  WBC 11.7* 9.9  --  9.3 9.8 8.7 8.5  NEUTROABS 9.5*  --   --   --   --   --   --  HGB 7.8* 6.7* 6.9* 8.7* 8.6* 8.4* 8.5*  HCT 30.9* 26.6* 26.8* 32.4* 32.5* 31.8* 31.7*  MCV 66.2* 64.9*  --  68.1* 68.3* 68.8* 68.9*  PLT 365 360  --  328 355 340 357   Basic Metabolic Panel: Recent Labs  Lab 10/08/18 1325 10/09/18 0558 10/11/18 0452  NA 133* 135 134*  K 3.3* 3.3* 3.5  CL 98 97* 98  CO2 24 23 25   GLUCOSE 124* 126* 124*  BUN  14 13 11   CREATININE 1.05* 0.99 1.10*  CALCIUM 8.7* 8.5* 8.7*  MG 2.2  --   --    GFR: Estimated Creatinine Clearance: 106.9 mL/min (A) (by C-G formula based on SCr of 1.1 mg/dL (H)). Liver Function Tests: Recent Labs  Lab 10/08/18 1325 10/11/18 0452  AST 22 20  ALT 21 20  ALKPHOS 84 70  BILITOT 0.8 0.6  PROT 8.4* 7.0  ALBUMIN 3.3* 2.6*   No results for input(s): LIPASE, AMYLASE in the last 168 hours. No results for input(s): AMMONIA in the last 168 hours. Coagulation Profile: Recent Labs  Lab 10/08/18 1327 10/12/18 0341 10/13/18 0334  INR 1.27 1.22 1.21   Cardiac Enzymes: Recent Labs  Lab 10/08/18 1325 10/08/18 1858 10/09/18 0001 10/09/18 0558  TROPONINI <0.03 0.06* 0.06* 0.04*   BNP (last 3 results) No results for input(s): PROBNP in the last 8760 hours. HbA1C: No results for input(s): HGBA1C in the last 72 hours. CBG: No results for input(s): GLUCAP in the last 168 hours. Lipid Profile: No results for input(s): CHOL, HDL, LDLCALC, TRIG, CHOLHDL, LDLDIRECT in the last 72 hours. Thyroid Function Tests: No results for input(s): TSH, T4TOTAL, FREET4, T3FREE, THYROIDAB in the last 72 hours. Anemia Panel: Recent Labs    10/11/18 0452  VITAMINB12 198  FOLATE 7.2  FERRITIN 22  TIBC 323  IRON 21*  RETICCTPCT 1.6   Sepsis Labs: No results for input(s): PROCALCITON, LATICACIDVEN in the last 168 hours.  No results found for this or any previous visit (from the past 240 hour(s)).       Radiology Studies: No results found.      Scheduled Meds: . coumadin book   Does not apply Once  . polyethylene glycol  17 g Oral Daily  . sodium chloride flush  3 mL Intravenous Q12H  . warfarin  12.5 mg Oral ONCE-1800  . warfarin   Does not apply Once  . Warfarin - Pharmacist Dosing Inpatient   Does not apply q1800   Continuous Infusions: . sodium chloride    . heparin 1,900 Units/hr (10/13/18 1130)     LOS: 5 days      Alwyn Ren, MD [facility]  If 7PM-7AM, please contact night-coverage [email or web address] Password [email or web address] 10/13/2018, 1:52 PM

## 2018-10-13 NOTE — Plan of Care (Signed)
  Problem: Education: Goal: Knowledge of General Education information will improve Description: Including pain rating scale, medication(s)/side effects and non-pharmacologic comfort measures Outcome: Progressing   Problem: Health Behavior/Discharge Planning: Goal: Ability to manage health-related needs will improve Outcome: Progressing   Problem: Clinical Measurements: Goal: Diagnostic test results will improve Outcome: Progressing   

## 2018-10-13 NOTE — Progress Notes (Signed)
ANTICOAGULATION CONSULT NOTE - Follow Up Consult  Pharmacy Consult for Heparin/Coumadin Indication: DVT  Allergies  Allergen Reactions  . Codeine     Patient Measurements: Height: 5\' 5"  (165.1 cm) Weight: (!) 450 lb (204.1 kg) IBW/kg (Calculated) : 57 Heparin Dosing Weight:  111.1 kg  Vital Signs: Temp: 98.3 F (36.8 C) (12/09 0331) Temp Source: Oral (12/09 0331) BP: 120/70 (12/09 0331) Pulse Rate: 81 (12/09 0331)  Labs: Recent Labs    10/11/18 0452 10/12/18 0338 10/12/18 0341 10/13/18 0334  HGB 8.6*  --  8.4* 8.5*  HCT 32.5*  --  31.8* 31.7*  PLT 355  --  340 357  LABPROT  --   --  15.3* 15.2  INR  --   --  1.22 1.21  HEPARINUNFRC 0.58 0.63  --  0.57  CREATININE 1.10*  --   --   --     Estimated Creatinine Clearance: 106.9 mL/min (A) (by C-G formula based on SCr of 1.1 mg/dL (H)).   Assessment: Anticoag: Heparin for extensive DVT + intermediate probability for PE (VQ scan). Warf started 12/7 - Hgb 8.5 stable. Plts 357 stable. HL 0.57 in goal. INR 1.21  Goal of Therapy:  Heparin level 0.3-0.7 INR 2-3 Monitor platelets by anticoagulation protocol: Yes   Plan:  Continue heparin drip at 1900 units/hr Warfarin 12.5 mg PO x 1 Monitor daily heparin level/INR/CBC, s/sx bleeding D/c heparin after 5 days + INR therapeutic >24hrs   Makenize Messman S. Merilynn Finlandobertson, PharmD, BCPS Clinical Staff Pharmacist Misty Stanleyobertson, Millenia Waldvogel Stillinger 10/13/2018,8:26 AM

## 2018-10-14 LAB — CBC
HCT: 32.3 % — ABNORMAL LOW (ref 36.0–46.0)
Hemoglobin: 8.4 g/dL — ABNORMAL LOW (ref 12.0–15.0)
MCH: 18.1 pg — ABNORMAL LOW (ref 26.0–34.0)
MCHC: 26 g/dL — AB (ref 30.0–36.0)
MCV: 69.5 fL — AB (ref 80.0–100.0)
Platelets: 361 10*3/uL (ref 150–400)
RBC: 4.65 MIL/uL (ref 3.87–5.11)
RDW: 24.9 % — ABNORMAL HIGH (ref 11.5–15.5)
WBC: 8.3 10*3/uL (ref 4.0–10.5)
nRBC: 0 % (ref 0.0–0.2)

## 2018-10-14 LAB — HEPARIN LEVEL (UNFRACTIONATED): Heparin Unfractionated: 0.63 IU/mL (ref 0.30–0.70)

## 2018-10-14 LAB — PROTIME-INR
INR: 1.55
Prothrombin Time: 18.4 seconds — ABNORMAL HIGH (ref 11.4–15.2)

## 2018-10-14 MED ORDER — WARFARIN SODIUM 2.5 MG PO TABS
12.5000 mg | ORAL_TABLET | Freq: Once | ORAL | Status: AC
  Administered 2018-10-14: 12.5 mg via ORAL
  Filled 2018-10-14: qty 1

## 2018-10-14 NOTE — Progress Notes (Signed)
PROGRESS NOTE    Nelma Phagan  ZOX:[id number] DOB: 02-11-1966 DOA: 10/08/2018 PCP: Patient, No Pcp Per  Brief Narrative:52 y.o.femalew/ a hx ofhypertension, hyperlipidemia andsignificant lymphedema affecting her lower extremities bilaterally who presentedwith 1 day ofsubsternalchest pain. [facility] involvingher iliac veins, common femoral vein, and popliteal vein.Patient admitted for acute left lower extremity DVT.   Assessment & Plan:   Active Problems:   DVT (deep venous thrombosis) (HCC)   Acute pulmonary embolism (HCC)   Anemia  Acute extensive left lower extremity DVT-continue heparin and Coumadin. Patient reports she has no health insurance and will not be able to afford newer agents.  INR is 1.5 to continue heparin and Coumadin plan discharge once INR is above 2.0.  Chest pain- Clinically suspected PE VQ was intermediate prob for PE- body habitus will not allow CTangio - in this clinical setting the index of suspicion for PE is quite high - will tx empirically as a PE - given extensive confirmed LE DVT, and probable PE, in the setting of severe morbid obesity, I feel lifelong anticoag will likely be required  Chronic microcytic anemia No sign of overt bleedingbut has required 2U PRBC this admiT  Chronic lower extremity lymphedema No apparent acute issues  Ambulatory dysfunction secondary to bilateral knee osteoarthritis Uses a cane at baseline- PT/OT  Hypertension BP controlled  History of noncompliance Needs reinforcementas well as signif financial assistance      Estimated body mass index is 74.88 kg/m as calculated from the following:   Height as of this encounter: 5\' 5"  (1.651 m).   Weight as of this encounter: 204.1 kg.  DVT prophylaxis:heparin coumadin Code Status full Family Communication none Disposition Plan: .await inr to be  therapeutic   Consultants:  none  Procedures: none Antimicrobials:none  Subjective:had bms no new complaints   Objective: Vitals:   10/14/18 0500 10/14/18 0700 10/14/18 0900 10/14/18 1002  BP:    128/67  Pulse:    76  Resp: 18 (!) 21 18 (!) 26  Temp:    98.3 F (36.8 C)  TempSrc:    Oral  SpO2:    98%  Weight:      Height:        Intake/Output Summary (Last 24 hours) at 10/14/2018 1217 Last data filed at 10/14/2018 0830 Gross per 24 hour  Intake 840 ml  Output 0 ml  Net 840 ml   Filed Weights   10/08/18 1317  Weight: (!) 204.1 kg    Examination:  General exam: Appears calm and comfortable  Respiratory system: Clear to auscultation. Respiratory effort normal. Cardiovascular system: S1 & S2 heard, RRR. No JVD, murmurs, rubs, gallops or clicks. No pedal edema. Gastrointestinal system: Abdomen is nondistended, soft and nontender. No organomegaly or masses felt. Normal bowel sounds heard. Central nervous system: Alert and oriented. No focal neurological deficits. Extremities: Symmetric 5 x 5 power. Skin: No rashes, lesions or ulcers Psychiatry: Judgement and insight appear normal. Mood & affect appropriate.     Data Reviewed: I have personally reviewed following labs and imaging studies  CBC: Recent Labs  Lab 10/08/18 1325  10/10/18 0336 10/11/18 0452 10/12/18 0341 10/13/18 0334 10/14/18 0332  WBC 11.7*   < > 9.3 9.8 8.7 8.5 8.3  NEUTROABS 9.5*  --   --   --   --   --   --   HGB 7.8*   < > 8.7* 8.6* 8.4* 8.5* 8.4*  HCT 30.9*   < >  32.4* 32.5* 31.8* 31.7* 32.3*  MCV 66.2*   < > 68.1* 68.3* 68.8* 68.9* 69.5*  PLT 365   < > 328 355 340 357 361   < > = values in this interval not displayed.   Basic Metabolic Panel: Recent Labs  Lab 10/08/18 1325 10/09/18 0558 10/11/18 0452  NA 133* 135 134*  K 3.3* 3.3* 3.5  CL 98 97* 98  CO2 24 23 25   GLUCOSE 124* 126* 124*  BUN 14 13 11   CREATININE 1.05* 0.99 1.10*  CALCIUM 8.7* 8.5* 8.7*  MG 2.2  --   --     GFR: Estimated Creatinine Clearance: 106.9 mL/min (A) (by C-G formula based on SCr of 1.1 mg/dL (H)). Liver Function Tests: Recent Labs  Lab 10/08/18 1325 10/11/18 0452  AST 22 20  ALT 21 20  ALKPHOS 84 70  BILITOT 0.8 0.6  PROT 8.4* 7.0  ALBUMIN 3.3* 2.6*   No results for input(s): LIPASE, AMYLASE in the last 168 hours. No results for input(s): AMMONIA in the last 168 hours. Coagulation Profile: Recent Labs  Lab 10/08/18 1327 10/12/18 0341 10/13/18 0334 10/14/18 0332  INR 1.27 1.22 1.21 1.55   Cardiac Enzymes: Recent Labs  Lab 10/08/18 1325 10/08/18 1858 10/09/18 0001 10/09/18 0558  TROPONINI <0.03 0.06* 0.06* 0.04*   BNP (last 3 results) No results for input(s): PROBNP in the last 8760 hours. HbA1C: No results for input(s): HGBA1C in the last 72 hours. CBG: No results for input(s): GLUCAP in the last 168 hours. Lipid Profile: No results for input(s): CHOL, HDL, LDLCALC, TRIG, CHOLHDL, LDLDIRECT in the last 72 hours. Thyroid Function Tests: No results for input(s): TSH, T4TOTAL, FREET4, T3FREE, THYROIDAB in the last 72 hours. Anemia Panel: No results for input(s): VITAMINB12, FOLATE, FERRITIN, TIBC, IRON, RETICCTPCT in the last 72 hours. Sepsis Labs: No results for input(s): PROCALCITON, LATICACIDVEN in the last 168 hours.  No results found for this or any previous visit (from the past 240 hour(s)).       Radiology Studies: No results found.      Scheduled Meds: . coumadin book   Does not apply Once  . polyethylene glycol  17 g Oral Daily  . sodium chloride flush  3 mL Intravenous Q12H  . warfarin  12.5 mg Oral ONCE-1800  . warfarin   Does not apply Once  . Warfarin - Pharmacist Dosing Inpatient   Does not apply q1800   Continuous Infusions: . sodium chloride    . heparin 1,900 Units/hr (10/13/18 2332)     LOS: 6 days     Alwyn RenElizabeth G , MD [facility]  If 7PM-7AM, please contact night-coverage [email or web address] Password  [email or web address] 10/14/2018, 12:17 PM

## 2018-10-14 NOTE — Plan of Care (Signed)
  Problem: Health Behavior/Discharge Planning: Goal: Ability to manage health-related needs will improve Outcome: Progressing   Problem: Clinical Measurements: Goal: Ability to maintain clinical measurements within normal limits will improve Outcome: Progressing Goal: Diagnostic test results will improve Outcome: Progressing   

## 2018-10-14 NOTE — Progress Notes (Signed)
ANTICOAGULATION CONSULT NOTE - Follow Up Consult  Pharmacy Consult for Heparin/Coumadin Indication: DVT, VQ scan with intermediate probability for PE  Allergies  Allergen Reactions  . Codeine     Patient Measurements: Height: 5\' 5"  (165.1 cm) Weight: (!) 450 lb (204.1 kg) IBW/kg (Calculated) : 57 Heparin Dosing Weight:  111.1 kg  Vital Signs: Temp: 98.9 F (37.2 C) (12/10 0404) Temp Source: Oral (12/10 0404) BP: 120/64 (12/10 0404) Pulse Rate: 79 (12/10 0404)  Labs: Recent Labs    10/12/18 0338  10/12/18 0341 10/13/18 0334 10/14/18 0332  HGB  --    < > 8.4* 8.5* 8.4*  HCT  --   --  31.8* 31.7* 32.3*  PLT  --   --  340 357 361  LABPROT  --   --  15.3* 15.2 18.4*  INR  --   --  1.22 1.21 1.55  HEPARINUNFRC 0.63  --   --  0.57 0.63   < > = values in this interval not displayed.    Estimated Creatinine Clearance: 106.9 mL/min (A) (by C-G formula based on SCr of 1.1 mg/dL (H)).   Assessment: 52 y.o. F on Heparin bridge to Warfarin for extensive DVT + intermediate probability for PE (VQ scan). Warf started 12/7 - today is Day #4 of minimum 5-day overlap or until INR therapeutic x 24h.  Heparin level remains therapeutic (0.63) on gtt at 1900 units/hr. INR up to 1.55 (finally moving on 12.5mg  dose). Hgb low but stable and plt wnl. No bleeding noted.  Goal of Therapy:  Heparin level 0.3-0.7 INR 2-3 Monitor platelets by anticoagulation protocol: Yes   Plan:  Continue heparin drip at 1900 units/hr Warfarin 12.5 mg PO x 1 Monitor daily heparin level/INR/CBC, s/sx bleeding D/c heparin after 5 days + INR therapeutic >24hrs  Victoria Cummings, PharmD, BCPS Clinical pharmacist  **Pharmacist phone directory can now be found on [email or web address] ([email or web address]).  Listed under [facility]. 10/14/2018,8:50 AM

## 2018-10-14 NOTE — Care Management Note (Addendum)
Case Management Note Donn PieriniKristi Tavarius Grewe RN, BSN Transitions of Care Unit 4E- RN Case Manager [phone]  Patient Details  Name: Victoria Cummings MRN: [id number] Date of Birth: 08/03/1966  Subjective/Objective:    Pt admitted with DVT                Action/Plan: PTA pt lived at home (motel) alone, CM spoke with pt at bedside for transition of care needs- per pt she uses cane and drives occasionally- pt reports that she will need transportation assistance at discharge as she does not have any family or anyone else to take her home. Will consult CSW for transport issues. Pt also is currently on 02- will need to assess for home 02 needs- pt is uninsured and states she was not on any 02 prior to admission. Pt does not have a local PCP- [place]. Pt will need a PCP for INR checks- [place] clinics [place] to see if an appointment is available for INR needs, discussed with pt cost for coumadin and TOC pharmacy for discharge needs- pt reports that she may be [facility] what scripts are provided. - CM to continue to follow for transition of care needs and potential MATCH needs.    Expected Discharge Date:                  Expected Discharge Plan:  Home/Self Care  In-House Referral:     Discharge planning Services  CM Consult, Indigent Health Clinic, Medication Assistance  Post Acute Care Choice:  Durable Medical Equipment Choice offered to:     DME Arranged:    DME Agency:     HH Arranged:    HH Agency:     Status of Service:  In process, will continue to follow  If discussed at Long Length of Stay Meetings, dates discussed:    Discharge Disposition:   Additional Comments:  10/14/18- 1015- Donn PieriniKristi Marlo Goodrich RN CM- [facility] do [facility]/INR checks at the [facility]- however she [facility] give [facility] that [facility]  - [facility]- spoke with Joni ReiningNicole- they do have their lab now open and can provide PT/INR checks- appointment made for Dec. 17 at 1:00pm with f/u with Antony OdeaJason Vaughn. There are appointments on Fri. 12/13 if pt is able to transition home earlier- CM can call back and change appointment clinic however does close at noon on fridays. CM will provide pt with info on Clinic and appointment prior to discharge. MD will need to give order for PT/INR check to fax to clinic. (fax # is [phone])  Darrold SpanWebster, Shannah Conteh Hall, RN 10/14/2018, 10:30 AM

## 2018-10-15 LAB — CBC
HCT: 32.2 % — ABNORMAL LOW (ref 36.0–46.0)
Hemoglobin: 8.5 g/dL — ABNORMAL LOW (ref 12.0–15.0)
MCH: 18 pg — ABNORMAL LOW (ref 26.0–34.0)
MCHC: 26.4 g/dL — ABNORMAL LOW (ref 30.0–36.0)
MCV: 68.4 fL — ABNORMAL LOW (ref 80.0–100.0)
Platelets: 370 10*3/uL (ref 150–400)
RBC: 4.71 MIL/uL (ref 3.87–5.11)
RDW: 25.1 % — ABNORMAL HIGH (ref 11.5–15.5)
WBC: 7.5 10*3/uL (ref 4.0–10.5)
nRBC: 0 % (ref 0.0–0.2)

## 2018-10-15 LAB — PROTIME-INR
INR: 1.75
Prothrombin Time: 20.2 seconds — ABNORMAL HIGH (ref 11.4–15.2)

## 2018-10-15 LAB — HEPARIN LEVEL (UNFRACTIONATED): Heparin Unfractionated: 0.54 IU/mL (ref 0.30–0.70)

## 2018-10-15 MED ORDER — WARFARIN SODIUM 7.5 MG PO TABS
15.0000 mg | ORAL_TABLET | Freq: Once | ORAL | Status: AC
  Administered 2018-10-15: 15 mg via ORAL
  Filled 2018-10-15: qty 2

## 2018-10-15 MED ORDER — WARFARIN SODIUM 2.5 MG PO TABS: 12.5000 mg | ORAL_TABLET | Freq: Once | ORAL | Status: DC

## 2018-10-15 NOTE — Progress Notes (Signed)
PROGRESS NOTE    Victoria Cummings  ZOX:[id number] DOB: 02/19/1966 DOA: 10/08/2018 PCP: Patient, No Pcp Per  Brief Narrative:52 y.o.femalew/ a hx ofhypertension, hyperlipidemia andsignificant lymphedema affecting her lower extremities bilaterally who presentedwith 1 day ofsubsternalchest pain. [facility] involvingher iliac veins, common femoral vein, and popliteal vein.Patient admitted for acute left lower extremity DVT.   Assessment & Plan:   Active Problems:   DVT (deep venous thrombosis) (HCC)   Acute pulmonary embolism (HCC)   Anemia  Acute extensive left lower extremity DVT-continue heparinand Coumadin.Patient reports she has no health insurance and will not be able to afford newer agents.INR is 1.7to continue heparin and Coumadin plan discharge once INR is above 2.0.will give 15 mg coumadin tonight.  Chest pain- Clinically suspected PE VQ was intermediate prob for PE- body habitus will not allow CTangio - in this clinical setting the index of suspicion for PE is quite high - will tx empirically as a PE - given extensive confirmed LE DVT, and probable PE, in the setting of severe morbid obesity, I feel lifelong anticoag will likely be required  Chronic microcytic anemia No sign of overt bleedingbut has required 2U PRBC this admiT  Chronic lower extremity lymphedema No apparent acute issues  Ambulatory dysfunction secondary to bilateral knee osteoarthritis Uses a cane at baseline- PT/OT  Hypertension BP controlled  History of noncompliance Needs reinforcementas well as signif financial assistance     Estimated body mass index is 74.88 kg/m as calculated from the following:   Height as of this encounter: 5\' 5"  (1.651 m).   Weight as of this encounter: 204.1 kg.  DVT prophylaxis:heparin coumadin Code Status: full Family Communication: none Disposition Plan:   Await  therapeutic INR Consultants:   NONE  Procedures:  NONE Antimicrobials: NONE  Subjective: NO COMPLAINTS  Objective: Vitals:   10/15/18 0319 10/15/18 0500 10/15/18 0700 10/15/18 0900  BP: 119/67     Pulse: 88 88    Resp: (!) 25 19 19  (!) 29  Temp: 98.5 F (36.9 C)     TempSrc: Oral     SpO2: 95% 96%    Weight:      Height:        Intake/Output Summary (Last 24 hours) at 10/15/2018 1124 Last data filed at 10/15/2018 [id number] Gross per 24 hour  Intake 1200 ml  Output 3250 ml  Net -2050 ml   Filed Weights   10/08/18 1317  Weight: (!) 204.1 kg    Examination:  General exam: Appears calm and comfortable  Respiratory system: Clear to auscultation. Respiratory effort normal. Cardiovascular system: S1 & S2 heard, RRR. No JVD, murmurs, rubs, gallops or clicks. No pedal edema. Gastrointestinal system: Abdomen is nondistended, soft and nontender. No organomegaly or masses felt. Normal bowel sounds heard. Central nervous system: Alert and oriented. No focal neurological deficits. Extremities: CHRONIC LYMPADEMA Skin: No rashes, lesions or ulcers Psychiatry: Judgement and insight appear normal. Mood & affect appropriate.     Data Reviewed: I have personally reviewed following labs and imaging studies  CBC: Recent Labs  Lab 10/08/18 1325  10/11/18 0452 10/12/18 0341 10/13/18 0334 10/14/18 0332 10/15/18 0402  WBC 11.7*   < > 9.8 8.7 8.5 8.3 7.5  NEUTROABS 9.5*  --   --   --   --   --   --   HGB 7.8*   < > 8.6* 8.4* 8.5* 8.4* 8.5*  HCT 30.9*   < > 32.5* 31.8* 31.7* 32.3*  32.2*  MCV 66.2*   < > 68.3* 68.8* 68.9* 69.5* 68.4*  PLT 365   < > 355 340 357 361 370   < > = values in this interval not displayed.   Basic Metabolic Panel: Recent Labs  Lab 10/08/18 1325 10/09/18 0558 10/11/18 0452  NA 133* 135 134*  K 3.3* 3.3* 3.5  CL 98 97* 98  CO2 24 23 25   GLUCOSE 124* 126* 124*  BUN 14 13 11   CREATININE 1.05* 0.99 1.10*  CALCIUM 8.7* 8.5* 8.7*  MG 2.2  --   --      GFR: Estimated Creatinine Clearance: 106.9 mL/min (A) (by C-G formula based on SCr of 1.1 mg/dL (H)). Liver Function Tests: Recent Labs  Lab 10/08/18 1325 10/11/18 0452  AST 22 20  ALT 21 20  ALKPHOS 84 70  BILITOT 0.8 0.6  PROT 8.4* 7.0  ALBUMIN 3.3* 2.6*   No results for input(s): LIPASE, AMYLASE in the last 168 hours. No results for input(s): AMMONIA in the last 168 hours. Coagulation Profile: Recent Labs  Lab 10/08/18 1327 10/12/18 0341 10/13/18 0334 10/14/18 0332 10/15/18 0402  INR 1.27 1.22 1.21 1.55 1.75   Cardiac Enzymes: Recent Labs  Lab 10/08/18 1325 10/08/18 1858 10/09/18 0001 10/09/18 0558  TROPONINI <0.03 0.06* 0.06* 0.04*   BNP (last 3 results) No results for input(s): PROBNP in the last 8760 hours. HbA1C: No results for input(s): HGBA1C in the last 72 hours. CBG: No results for input(s): GLUCAP in the last 168 hours. Lipid Profile: No results for input(s): CHOL, HDL, LDLCALC, TRIG, CHOLHDL, LDLDIRECT in the last 72 hours. Thyroid Function Tests: No results for input(s): TSH, T4TOTAL, FREET4, T3FREE, THYROIDAB in the last 72 hours. Anemia Panel: No results for input(s): VITAMINB12, FOLATE, FERRITIN, TIBC, IRON, RETICCTPCT in the last 72 hours. Sepsis Labs: No results for input(s): PROCALCITON, LATICACIDVEN in the last 168 hours.  No results found for this or any previous visit (from the past 240 hour(s)).       Radiology Studies: No results found.      Scheduled Meds: . coumadin book   Does not apply Once  . polyethylene glycol  17 g Oral Daily  . sodium chloride flush  3 mL Intravenous Q12H  . warfarin  15 mg Oral ONCE-1800  . warfarin   Does not apply Once  . Warfarin - Pharmacist Dosing Inpatient   Does not apply q1800   Continuous Infusions: . sodium chloride    . heparin 1,900 Units/hr (10/15/18 0215)     LOS: 7 days     Alwyn Ren, MD [facility]  If 7PM-7AM, please contact  night-coverage [email or web address] Password [facility] 10/15/2018, 11:24 AM

## 2018-10-15 NOTE — Progress Notes (Addendum)
ANTICOAGULATION CONSULT NOTE - Follow Up Consult  Pharmacy Consult for Heparin/Coumadin Indication: DVT, VQ scan with intermediate probability for PE  Allergies  Allergen Reactions  . Codeine     Patient Measurements: Height: 5\' 5"  (165.1 cm) Weight: (!) 450 lb (204.1 kg) IBW/kg (Calculated) : 57 Heparin Dosing Weight:  111.1 kg  Vital Signs: Temp: 98.5 F (36.9 C) (12/11 0319) Temp Source: Oral (12/11 0319) BP: 119/67 (12/11 0319) Pulse Rate: 88 (12/11 0319)  Labs: Recent Labs    10/13/18 0334 10/14/18 0332 10/15/18 0402  HGB 8.5* 8.4* 8.5*  HCT 31.7* 32.3* 32.2*  PLT 357 361 370  LABPROT 15.2 18.4* 20.2*  INR 1.21 1.55 1.75  HEPARINUNFRC 0.57 0.63 0.54    Estimated Creatinine Clearance: 106.9 mL/min (A) (by C-G formula based on SCr of 1.1 mg/dL (H)).   Assessment: 52 y.o. F on Heparin bridge to Warfarin for extensive DVT + intermediate probability for PE (VQ scan). Warf started 12/7 - today is Day #5 of minimum 5-day overlap or until INR therapeutic x 24h.  Heparin level remains therapeutic (0.54) on gtt at 1900 units/hr. INR up to 1.75 (moving nicely on 12.5mg  dose). Dr. Jerolyn CenterMathews called and requestedHgb low but stable and plt wnl. No bleeding noted.  Goal of Therapy:  Heparin level 0.3-0.7 units/ml INR 2-3 Monitor platelets by anticoagulation protocol: Yes   Plan:  Continue heparin drip at 1900 units/hr Warfarin 15mg  tonight - MD requested more aggressive dose Monitor daily heparin level/INR/CBC, s/sx bleeding D/c heparin after 5 days + INR therapeutic >24hrs  Christoper Fabianaron Kylar Speelman, PharmD, BCPS Clinical pharmacist  **Pharmacist phone directory can now be found on [email or web address] ([email or web address]).  Listed under [facility]. 10/15/2018,8:31 AM

## 2018-10-16 DIAGNOSIS — D649 Anemia, unspecified: Secondary | ICD-10-CM

## 2018-10-16 DIAGNOSIS — I824Y2 Acute embolism and thrombosis of unspecified deep veins of left proximal lower extremity: Secondary | ICD-10-CM

## 2018-10-16 DIAGNOSIS — I2699 Other pulmonary embolism without acute cor pulmonale: Secondary | ICD-10-CM

## 2018-10-16 DIAGNOSIS — I82492 Acute embolism and thrombosis of other specified deep vein of left lower extremity: Secondary | ICD-10-CM

## 2018-10-16 LAB — CBC
HCT: 30.4 % — ABNORMAL LOW (ref 36.0–46.0)
Hemoglobin: 8.3 g/dL — ABNORMAL LOW (ref 12.0–15.0)
MCH: 18.6 pg — AB (ref 26.0–34.0)
MCHC: 27.3 g/dL — ABNORMAL LOW (ref 30.0–36.0)
MCV: 68.2 fL — ABNORMAL LOW (ref 80.0–100.0)
Platelets: 365 10*3/uL (ref 150–400)
RBC: 4.46 MIL/uL (ref 3.87–5.11)
RDW: 24.7 % — ABNORMAL HIGH (ref 11.5–15.5)
WBC: 7.1 10*3/uL (ref 4.0–10.5)
nRBC: 0 % (ref 0.0–0.2)

## 2018-10-16 LAB — HEPARIN LEVEL (UNFRACTIONATED): Heparin Unfractionated: 0.91 IU/mL — ABNORMAL HIGH (ref 0.30–0.70)

## 2018-10-16 LAB — PROTIME-INR
INR: 2.27
Prothrombin Time: 24.7 seconds — ABNORMAL HIGH (ref 11.4–15.2)

## 2018-10-16 MED ORDER — WARFARIN SODIUM 5 MG PO TABS: ORAL_TABLET | ORAL | 1 refills | Status: DC

## 2018-10-16 MED ORDER — ACETAMINOPHEN 325 MG PO TABS: 650.0000 mg | ORAL_TABLET | Freq: Four times a day (QID) | ORAL | Status: AC | PRN

## 2018-10-16 MED ORDER — BISACODYL 5 MG PO TBEC: 10.0000 mg | DELAYED_RELEASE_TABLET | Freq: Every day | ORAL | 0 refills | Status: DC | PRN

## 2018-10-16 MED ORDER — CENTRUM PO CHEW: 1.0000 | CHEWABLE_TABLET | Freq: Every day | ORAL | Status: DC

## 2018-10-16 MED FILL — WARFARIN SODIUM 5 MG TABLET: 5 | 30 days supply | Qty: 60 | Fill #0 | Status: TO

## 2018-10-16 NOTE — Progress Notes (Signed)
Discharge instructions reviewed with patient and she denies questions. Medications provided by pharmacy and patient able to identify concerns regarding warfarin and who to call for problems. [place] all belongings.

## 2018-10-16 NOTE — Discharge Summary (Signed)
Physician Discharge Summary  Dannielle Huhnnette Wetherby [id number] DOB: 08/10/1966 DOA: 10/08/2018  PCP: Patient, No Pcp Per  Admit date: 10/08/2018 Discharge date: 10/16/2018  Admitted From:home Disposition: home  Recommendations for Outpatient Follow-up:  1. Follow up with PCP Monday 12/16 2. Please check INR CBC BMP 12/17  Home Health:NONE Equipment/Devices:NONE Discharge Condition STBALE CODE STATUS: Full code Diet recommendation: Cardiac diet  Brief/Interim Summary:52 y.o.femalew/ a hx ofhypertension, hyperlipidemia andsignificant lymphedema affecting her lower extremities bilaterally who presentedwith 1 day ofsubsternalchest pain. [facility] involvingher iliac veins, common femoral vein, and popliteal vein.Patient admitted for acute left lower extremity DVT.    Discharge Diagnoses:  Active Problems:   DVT (deep venous thrombosis) (HCC)   Acute pulmonary embolism (HCC)   Anemia  Acute extensive left lower extremity DVT-continue heparinand Coumadin.Patient reports she has no health insurance and will not be able to afford newer agents.INR is now above 2 .DC heparin .continue Coumadin plan for discharge today on 10 mg of Coumadin.  Patient to follow-up with PCP 10/21/2018 to check PT/INR CBC and BMP.  Depending on the INR on 1217 her Coumadin dose will need to be adjusted.   Chest pain- Clinically suspected PE  VQ was intermediate prob for PE- body habitus will not allow CTangio - in this clinical setting the index of suspicion for PE is quite high - will tx empirically as a PE - given extensive confirmed LE DVT, and probable PE, in the setting of severe morbid obesity, I feel lifelong anticoag will likely be required  Chronic microcytic anemia No sign of overt bleedingbut has required 2U PRBC this admiT continue multivitamin with minerals and iron.  Chronic lower extremity lymphedema No  apparent acute issues  Ambulatory dysfunction secondary to bilateral knee osteoarthritis Uses a cane at baseline- PT/OT  Hypertension BP controlled       Estimated body mass index is 74.88 kg/m as calculated from the following:   Height as of this encounter: 5\' 5"  (1.651 m).   Weight as of this encounter: 204.1 kg.  Discharge Instructions  Discharge Instructions    Call MD for:  persistant dizziness or light-headedness   Complete by:  As directed    Call MD for:  persistant nausea and vomiting   Complete by:  As directed    Call MD for:  severe uncontrolled pain   Complete by:  As directed    Diet - low sodium heart healthy   Complete by:  As directed    Increase activity slowly   Complete by:  As directed      Allergies as of 10/16/2018      Reactions   Codeine       Medication List    TAKE these medications   acetaminophen 325 MG tablet Commonly known as:  TYLENOL Take 2 tablets (650 mg total) by mouth every 6 (six) hours as needed for mild pain, fever or headache.   bisacodyl 5 MG EC tablet Commonly known as:  DULCOLAX Take 2 tablets (10 mg total) by mouth daily as needed for moderate constipation.   multivitamin-iron-minerals-folic acid chewable tablet Chew 1 tablet by mouth daily.   warfarin 5 MG tablet Commonly known as:  COUMADIN Take 2 tablets daily till 12/16 Check INR 12/16 and ask pcp how much coumadin you should be taking      Follow-up Information    [facility] Go on [facility].   Why:  f/u appointment and INR check  at 1:00 with Antony Odea Contact information: [address] [place] phone # [phone]         Allergies  Allergen Reactions  . Codeine     Consultations:  None   Procedures/Studies: Nm Pulmonary Perfusion  Result Date: 10/08/2018 CLINICAL DATA:  Left lower extremity lymphedema. Chest pain. Cough. Morbid obesity. EXAM: NUCLEAR MEDICINE VENTILATION AND PERFUSION SCAN  TECHNIQUE: Perfusion images were obtained in multiple supine projections after intravenous injection of radiopharmaceutical. Patient unable to stand or lie prone. Ventilation images were attempted but could not be performed due to patient limitations. Patient unable to keep nebulizer in mouth. RADIOPHARMACEUTICALS:  4 mCi Tc82m MAA-IV COMPARISON:  Chest radiograph from earlier today. FINDINGS: Ventilation: Not performed due to patient limitations. Perfusion: There are multiple moderate segmental perfusion defects in the upper lungs bilaterally (at least two in each lung). IMPRESSION: Intermediate probability for pulmonary embolism. Multiple moderate segmental perfusion defects in the upper lungs bilaterally. Ventilation imaging could not be performed due to patient limitations. These results were called by telephone at the time of interpretation on 10/08/2018 at 5:14 pm to Dr. Derwood Kaplan , who verbally acknowledged these results. Electronically Signed   By: Delbert Phenix M.D.   On: 10/08/2018 17:15   US Venous Img Lower Bilateral  Result Date: 10/08/2018 CLINICAL DATA:  52 year old female with a history of left lower extremity pain and lymphedema EXAM: BILATERAL LOWER EXTREMITY VENOUS DOPPLER ULTRASOUND TECHNIQUE: Gray-scale sonography with graded compression, as well as color Doppler and duplex ultrasound were performed to evaluate the lower extremity deep venous systems from the level of the common femoral vein and including the common femoral, femoral, profunda femoral, popliteal and calf veins including the posterior tibial, peroneal and gastrocnemius veins when visible. The superficial great saphenous vein was also interrogated. Spectral Doppler was utilized to evaluate flow at rest and with distal augmentation maneuvers in the common femoral, femoral and popliteal veins. COMPARISON:  None. FINDINGS: RIGHT LOWER EXTREMITY Common Femoral Vein: No evidence of thrombus. Normal compressibility, respiratory  phasicity and response to augmentation. Saphenofemoral Junction: No evidence of thrombus. Normal compressibility and flow on color Doppler imaging. Profunda Femoral Vein: No evidence of thrombus. Normal compressibility and flow on color Doppler imaging. Femoral Vein: No evidence of thrombus. Normal compressibility, respiratory phasicity and response to augmentation. Popliteal Vein: No evidence of thrombus. Normal compressibility, respiratory phasicity and response to augmentation. Calf Veins: Calf veins not visualized Superficial Great Saphenous Vein: No evidence of thrombus. Normal compressibility and flow on color Doppler imaging. Other Findings:  None. LEFT LOWER EXTREMITY Common Femoral Vein: Occlusive DVT of the common femoral vein extending into the profunda femoris and the saphenofemoral junction. Thrombus extends beyond the field of view into the iliac veins. Saphenofemoral Junction: Thrombus of the saphenofemoral junction. Profunda Femoral Vein: Occlusive thrombus of the profunda vein as it joins common femoral vein Femoral Vein: Occlusive DVT through the femoral vein extending at least to the mid segment. Patient was unable to tolerate a more comprehensive exam of the distal femoral vein and popliteal vein. Popliteal Vein: Not interrogated Calf Veins: Not interrogated Other Findings:  None. IMPRESSION: Right: Sonographic survey of the right lower extremity negative for DVT. Left: Occlusive proximal DVT of the common femoral vein, profunda vein, saphenofemoral junction, and extending at least distally to the mid femoral vein. Patient did not tolerate comprehensive exam of the distal femoral vein and popliteal vein. Evidence of extension of DVT in the left iliac veins. These results  were called by telephone at the time of interpretation on 10/08/2018 at 1:06 pm to Dr. Raeford Razor , who verbally acknowledged these results. Signed, Yvone Neu. Reyne Dumas, RPVI [facility]  [facility] Electronically Signed   By: Gilmer Mor D.O.   On: 10/08/2018 13:06   Dg Chest Portable 1 View  Result Date: 10/08/2018 CLINICAL DATA:  dyspnea. cough. sharp L sided CP. EXAM: PORTABLE CHEST 1 VIEW COMPARISON:  None. FINDINGS: Normal heart size. Lungs clear. No pneumothorax. No pleural effusion. IMPRESSION: No active cardiopulmonary disease. Electronically Signed   By: Jolaine Click M.D.   On: 10/08/2018 13:07    (Echo, Carotid, EGD, Colonoscopy, ERCP)    Subjective: Resting in bed no complaints no chest pain no shortness of breath anxious to go home Discharge Exam: Vitals:   10/15/18 2345 10/16/18 0330  BP:  119/60  Pulse:  84  Resp: (!) 23 19  Temp:  98.7 F (37.1 C)  SpO2:  92%   Vitals:   10/15/18 1730 10/15/18 1945 10/15/18 2345 10/16/18 0330  BP:  (!) 125/58  119/60  Pulse:  87  84  Resp: 19 (!) 21 (!) 23 19  Temp:  98.3 F (36.8 C)  98.7 F (37.1 C)  TempSrc:  Oral  Oral  SpO2:  95%  92%  Weight:      Height:        General: Pt is alert, awake, not in acute distress Cardiovascular: RRR, S1/S2 +, no rubs, no gallops Respiratory: CTA bilaterally, no wheezing, no rhonchi Abdominal: Soft, NT, ND, bowel sounds + Extremities: Chronic bilateral lymphedema   The results of significant diagnostics from this hospitalization (including imaging, microbiology, ancillary and laboratory) are listed below for reference.     Microbiology: No results found for this or any previous visit (from the past 240 hour(s)).   Labs: BNP (last 3 results) Recent Labs    10/08/18 1325  BNP 28.0   Basic Metabolic Panel: Recent Labs  Lab 10/11/18 0452  NA 134*  K 3.5  CL 98  CO2 25  GLUCOSE 124*  BUN 11  CREATININE 1.10*  CALCIUM 8.7*   Liver Function Tests: Recent Labs  Lab 10/11/18 0452  AST 20  ALT 20  ALKPHOS 70  BILITOT 0.6  PROT 7.0  ALBUMIN 2.6*   No results for input(s): LIPASE, AMYLASE in the last 168 hours. No results for  input(s): AMMONIA in the last 168 hours. CBC: Recent Labs  Lab 10/12/18 0341 10/13/18 0334 10/14/18 0332 10/15/18 0402 10/16/18 0305  WBC 8.7 8.5 8.3 7.5 7.1  HGB 8.4* 8.5* 8.4* 8.5* 8.3*  HCT 31.8* 31.7* 32.3* 32.2* 30.4*  MCV 68.8* 68.9* 69.5* 68.4* 68.2*  PLT 340 357 361 370 365   Cardiac Enzymes: No results for input(s): CKTOTAL, CKMB, CKMBINDEX, TROPONINI in the last 168 hours. BNP: Invalid input(s): POCBNP CBG: No results for input(s): GLUCAP in the last 168 hours. D-Dimer No results for input(s): DDIMER in the last 72 hours. Hgb A1c No results for input(s): HGBA1C in the last 72 hours. Lipid Profile No results for input(s): CHOL, HDL, LDLCALC, TRIG, CHOLHDL, LDLDIRECT in the last 72 hours. Thyroid function studies No results for input(s): TSH, T4TOTAL, T3FREE, THYROIDAB in the last 72 hours.  Invalid input(s): FREET3 Anemia work up No results for input(s): VITAMINB12, FOLATE, FERRITIN, TIBC, IRON, RETICCTPCT in the last 72 hours. Urinalysis    Component Value Date/Time   COLORURINE AMBER (A) 10/09/2018 1308  APPEARANCEUR CLOUDY (A) 10/09/2018 0752   LABSPEC 1.020 10/09/2018 0752   PHURINE 7.0 10/09/2018 0752   GLUCOSEU NEGATIVE 10/09/2018 0752   HGBUR NEGATIVE 10/09/2018 0752   BILIRUBINUR NEGATIVE 10/09/2018 0752   KETONESUR 5 (A) 10/09/2018 0752   PROTEINUR NEGATIVE 10/09/2018 0752   NITRITE NEGATIVE 10/09/2018 0752   LEUKOCYTESUR SMALL (A) 10/09/2018 0752   Sepsis Labs Invalid input(s): PROCALCITONIN,  WBC,  LACTICIDVEN Microbiology No results found for this or any previous visit (from the past 240 hour(s)).   Time coordinating discharge: 34  minutes  SIGNED:   Alwyn Ren, MD  [facility] 10/16/2018, 9:37 AM Pager   If 7PM-7AM, please contact night-coverage [email or web address] Password [email or web address]

## 2018-10-16 NOTE — Plan of Care (Signed)

## 2018-10-16 NOTE — Care Management Note (Signed)
Case Management Note Donn PieriniKristi Dennys Guin RN, BSN Transitions of Care Unit 4E- RN Case Manager [phone]  Patient Details  Name: Victoria Cummings MRN: [id number] Date of Birth: 08/10/1966  Subjective/Objective:    Pt admitted with DVT                Action/Plan: PTA pt lived at home (motel) alone, CM spoke with pt at bedside for transition of care needs- per pt she uses cane and drives occasionally- pt reports that she will need transportation assistance at discharge as she does not have any family or anyone else to take her home. Will consult CSW for transport issues. Pt also is currently on 02- will need to assess for home 02 needs- pt is uninsured and states she was not on any 02 prior to admission. Pt does not have a local PCP- [place]. Pt will need a PCP for INR checks- [place] will connect clinics there to see if an appointment is available for INR needs, discussed with pt cost for coumadin and TOC pharmacy for discharge needs- pt reports that [facility] what [facility] are provided. - CM to continue to follow for transition of care needs and potential MATCH needs.    Expected Discharge Date:  10/16/18               Expected Discharge Plan:  Home/Self Care  In-House Referral:  NA  Discharge planning Services  CM Consult, Indigent Health Clinic, Medication Assistance, Follow-up appt scheduled  Post Acute Care Choice:  NA Choice offered to:  NA  DME Arranged:    DME Agency:     HH Arranged:    HH Agency:     Status of Service:  Completed, signed off  If discussed at [employer] Length of Stay Meetings, dates discussed:    Discharge Disposition: home/self care   Additional Comments:  10/16/18- 1140- Marshall Kampf RN, CM- pt for transition home today- have confirmed with pt [place] [facility] [employer] [facility], CSW to provide taxi voucher for transportation. CM has provided pt with  info [facility] and faxed d/c summary and order for PT/INR and Bmet to center via epic to fax [phone] (clinic phone # is [phone]). TOC pharmacy to fill coumadin and deliver to bedside prior to discharge- pt reports that she can pay the copay and will not need MATCH assistance.   10/14/18- 1015- Donn PieriniKristi Ankith Edmonston RN CM- [facility]- they no longer do PT/INR checks at the HD- however she did give name of new clinic [facility] that may could help - [facility]- spoke with Joni ReiningNicole- they do have their lab now open and can provide PT/INR checks- appointment made for Dec. 17 at 1:00pm with f/u with Antony OdeaJason Vaughn. There are appointments on Fri. 12/13 if pt is able to transition home earlier- CM can call back and change appointment clinic however does close at noon on fridays. CM will provide pt with info on Clinic and appointment prior to discharge. MD will need to give order for PT/INR check to fax to clinic. (fax # is [phone])  Darrold SpanWebster, Ercilia Bettinger Hall, RN 10/16/2018, 11:43 AM

## 2018-10-16 NOTE — Progress Notes (Signed)
CSW spoe with the patient at bedside. She was alert and oriented. CSW inquired transportation. Patient states she has no other means of transportation except taxi. Patient states [facility] [place] Patient states she was transported by ambulance but her car is at the hotel. CSW called [employer] to confirm availability of appropriate transportation needed to transport the patient comfortably.   Taxi voucher placed on patient's chart. CSW informed the RN.  Antony Blackbirdynthia Maryclare Nydam, [facility] Clinical Social Worker [phone]

## 2018-11-19 ENCOUNTER — Emergency Department (HOSPITAL_COMMUNITY)
Admission: EM | Admit: 2018-11-19 | Discharge: 2018-11-19 | Disposition: A | Discharge status: Home / Self Care | Payer: Medicaid Other | Attending: Emergency Medicine | Admitting: Emergency Medicine

## 2018-11-19 ENCOUNTER — Encounter (HOSPITAL_COMMUNITY): Payer: Self-pay

## 2018-11-19 DIAGNOSIS — Z86718 Personal history of other venous thrombosis and embolism: Secondary | ICD-10-CM | POA: Diagnosis not present

## 2018-11-19 DIAGNOSIS — R791 Abnormal coagulation profile: Secondary | ICD-10-CM | POA: Diagnosis not present

## 2018-11-19 DIAGNOSIS — Z7901 Long term (current) use of anticoagulants: Secondary | ICD-10-CM | POA: Insufficient documentation

## 2018-11-19 HISTORY — DX: Bronchitis, not specified as acute or chronic: J40

## 2018-11-19 HISTORY — DX: Acute embolism and thrombosis of unspecified deep veins of unspecified lower extremity: I82.409

## 2018-11-19 NOTE — Discharge Instructions (Addendum)
Hold Coumadin on Wednesday and Thursday.  Start Friday evening with 5 mg daily (which is 1 tablet).  Follow-up with your doctor next week for appropriate blood testing

## 2018-11-19 NOTE — ED Provider Notes (Addendum)
[facility] EMERGENCY DEPARTMENT Provider Note   CSN: [id number] Arrival date & time: 11/19/18  1219     History   Chief Complaint Chief Complaint  Patient presents with  . Abnormal Lab    HPI Victoria Cummings is a 53 y.o. female.  Patient arrives from her primary care office which is the [facility] in [place], [place] (nurse Amy [phone]).  Patient has been on Coumadin 10 mg daily since December for a DVT in her left leg.  She has not had her INR checked until yesterday.  She is feeling fine.  No episodes of bleeding whatsoever.  INR was reported to be 6.6.  Hemoglobin 9.4.  Platelets 657.  Patient is morbidly obese, but she has no concerns.     Past Medical History:  Diagnosis Date  . Bronchitis   . DVT (deep venous thrombosis) (HCC)   . Hyperlipidemia   . Hypertension   . Morbid obesity (HCC)   . Pneumonia     Patient Active Problem List   Diagnosis Date Noted  . Acute pulmonary embolism (HCC)   . Anemia   . DVT (deep venous thrombosis) (HCC) 10/08/2018    History reviewed. No pertinent surgical history.   OB History   No obstetric history on file.      Home Medications    Prior to Admission medications   Medication Sig Start Date End Date Taking? Authorizing Provider  acetaminophen (TYLENOL) 325 MG tablet Take 2 tablets (650 mg total) by mouth every 6 (six) hours as needed for mild pain, fever or headache. 10/16/18   Alwyn Ren, MD  bisacodyl (DULCOLAX) 5 MG EC tablet Take 2 tablets (10 mg total) by mouth daily as needed for moderate constipation. 10/16/18   Alwyn Ren, MD  multivitamin-iron-minerals-folic acid (CENTRUM) chewable tablet Chew 1 tablet by mouth daily. 10/16/18   Alwyn Ren, MD  warfarin (COUMADIN) 5 MG tablet Take 2 tablets daily till 12/16 Check INR 12/16 and ask pcp how much coumadin you should be taking 10/16/18   Alwyn Ren, MD    Family History No family history on  file.  Social History Social History   Tobacco Use  . Smoking status: Never Smoker  . Smokeless tobacco: Never Used  Substance Use Topics  . Alcohol use: Not Currently  . Drug use: Never     Allergies   Codeine   Review of Systems Review of Systems  All other systems reviewed and are negative.    Physical Exam Updated Vital Signs BP (!) 127/54 (BP Location: Left Arm)   Pulse 96   Temp 98.1 F (36.7 C) (Oral)   Resp 20   Ht 5\' 5"  (1.651 m)   Wt (!) 203.2 kg   LMP 10/29/2018   SpO2 96%   BMI 74.55 kg/m   Physical Exam Vitals signs and nursing note reviewed.  Constitutional:      General: She is not in acute distress.    Appearance: She is well-developed.     Comments: Obese  HENT:     Head: Normocephalic and atraumatic.     Nose: Nose normal.  Eyes:     Conjunctiva/sclera: Conjunctivae normal.  Neck:     Musculoskeletal: Neck supple.  Cardiovascular:     Rate and Rhythm: Normal rate.  Pulmonary:     Effort: Pulmonary effort is normal. No respiratory distress.  Abdominal:     Tenderness: There is no abdominal tenderness.  Musculoskeletal:  Comments: Able to walk  Skin:    General: Skin is warm and dry.  Neurological:     General: No focal deficit present.     Mental Status: She is alert and oriented to person, place, and time.  Psychiatric:        Mood and Affect: Mood normal.        Behavior: Behavior normal.      ED Treatments / Results  Labs (all labs ordered are listed, but only abnormal results are displayed) Labs Reviewed - No data to display  EKG None  Radiology No results found.  Procedures Procedures (including critical care time)  Medications Ordered in ED Medications - No data to display   Initial Impression / Assessment and Plan / ED Course  I have reviewed the triage vital signs and the nursing notes.  Pertinent labs & imaging results that were available during my care of the patient were reviewed by me and  considered in my medical decision making (see chart for details).     Patient chief complaint is elevated INR.  She is feeling fine.  Will hold Coumadin 10 mg daily on Wednesday and Thursday.  Restart Coumadin 5 mg daily on Friday.  She will have her INR rechecked next week.  Final Clinical Impressions(s) / ED Diagnoses   Final diagnoses:  Elevated INR    ED Discharge Orders    None       Donnetta Hutching, MD 11/19/18 1339    Donnetta Hutching, MD 11/19/18 1442

## 2018-11-19 NOTE — ED Triage Notes (Signed)
Pt reports went to [facility] for blood work Monday.  Reports was called today and told to come to the hospital because her pt was 60.7 and INR was 6.6.  Reports hemoglobin was 9.4 and platelets were 657.  Pt alert oriented.  Denies any symptoms.  Reports takes 10mg  coumadin daily.

## 2018-12-17 ENCOUNTER — Encounter: Payer: Self-pay | Admitting: Obstetrics and Gynecology

## 2018-12-18 ENCOUNTER — Telehealth (HOSPITAL_COMMUNITY): Payer: Self-pay | Admitting: Physical Therapy

## 2018-12-18 NOTE — Telephone Encounter (Signed)
Called pt she states she forgot to bring card in Monday. I explaine to pt that we need to card to verify benefits. She knows to bring the card to [place]. NF 12/18/2018 Pt states Medicaid is pending> Pt will come by Monday 12/15/18 to bring a card that her case manager gave her. It's called [employer] -I ask Beth if she knew the answer and she did not. If we don't take this card the pt could be self-pay. Give pt her schedule it's at the front desk. NF 12/12/2018 Referral under REFERRAL TAB

## 2018-12-22 ENCOUNTER — Telehealth (HOSPITAL_COMMUNITY): Payer: Self-pay | Admitting: Physical Therapy

## 2018-12-22 NOTE — Telephone Encounter (Signed)
Called pt to let her know she has no insurance and would be self-pay. Pt is waiting on Disablity and Medicaid - She had no insurance at this time. Pt will get a new referral once she gets approved for some kind of coverage

## 2018-12-23 ENCOUNTER — Ambulatory Visit (HOSPITAL_COMMUNITY): Payer: Self-pay | Admitting: Physical Therapy

## 2018-12-23 ENCOUNTER — Encounter (HOSPITAL_COMMUNITY): Payer: Self-pay

## 2018-12-24 ENCOUNTER — Encounter: Payer: Self-pay | Admitting: Obstetrics and Gynecology

## 2018-12-24 ENCOUNTER — Telehealth (HOSPITAL_COMMUNITY): Payer: Self-pay | Admitting: Physical Therapy

## 2018-12-24 NOTE — Telephone Encounter (Signed)
Victoria Cummings faxed req for medical records. There are not medical records b/c Patient was scheduled and cx due to Financial Hardship - patient has no ins to help with cost of this service. Faxed this information to MD's office to let them know pt no seen.

## 2018-12-25 ENCOUNTER — Ambulatory Visit (HOSPITAL_COMMUNITY): Payer: Self-pay | Admitting: Physical Therapy

## 2018-12-31 ENCOUNTER — Encounter (HOSPITAL_COMMUNITY): Payer: Self-pay | Admitting: Physical Therapy

## 2019-01-02 ENCOUNTER — Encounter (HOSPITAL_COMMUNITY): Payer: Self-pay | Admitting: Physical Therapy

## 2019-01-05 ENCOUNTER — Encounter (HOSPITAL_COMMUNITY): Payer: Self-pay | Admitting: Physical Therapy

## 2019-01-07 ENCOUNTER — Encounter (HOSPITAL_COMMUNITY): Payer: Self-pay | Admitting: Physical Therapy

## 2019-01-09 ENCOUNTER — Encounter (HOSPITAL_COMMUNITY): Payer: Self-pay | Admitting: Physical Therapy

## 2019-01-12 ENCOUNTER — Encounter (HOSPITAL_COMMUNITY): Payer: Self-pay | Admitting: Physical Therapy

## 2019-01-14 ENCOUNTER — Encounter (HOSPITAL_COMMUNITY): Payer: Self-pay | Admitting: Physical Therapy

## 2019-01-16 ENCOUNTER — Encounter (HOSPITAL_COMMUNITY): Payer: Self-pay | Admitting: Physical Therapy

## 2019-01-19 ENCOUNTER — Encounter (HOSPITAL_COMMUNITY): Payer: Self-pay | Admitting: Physical Therapy

## 2019-01-21 ENCOUNTER — Encounter (HOSPITAL_COMMUNITY): Payer: Self-pay | Admitting: Physical Therapy

## 2019-01-23 ENCOUNTER — Encounter (HOSPITAL_COMMUNITY): Payer: Self-pay | Admitting: Physical Therapy

## 2019-01-26 ENCOUNTER — Encounter (HOSPITAL_COMMUNITY): Payer: Self-pay | Admitting: Physical Therapy

## 2019-01-28 ENCOUNTER — Encounter (HOSPITAL_COMMUNITY): Payer: Self-pay | Admitting: Physical Therapy

## 2019-01-30 ENCOUNTER — Encounter (HOSPITAL_COMMUNITY): Payer: Self-pay | Admitting: Physical Therapy

## 2019-03-25 ENCOUNTER — Telehealth (HOSPITAL_COMMUNITY): Payer: Self-pay | Admitting: Physical Therapy

## 2019-03-25 NOTE — Telephone Encounter (Signed)
Md office called wanting to send in a new referral for wound care for this patient since she has insurance coverage now. Md office will fax new referral today.

## 2019-03-26 ENCOUNTER — Encounter: Payer: Self-pay | Admitting: Adult Health

## 2019-04-14 ENCOUNTER — Encounter: Payer: Self-pay | Admitting: Adult Health

## 2019-04-14 ENCOUNTER — Other Ambulatory Visit (HOSPITAL_COMMUNITY)
Admission: RE | Admit: 2019-04-14 | Discharge: 2019-04-14 | Disposition: A | Discharge status: Home / Self Care | Payer: Medicaid Other | Source: Ambulatory Visit | Attending: Adult Health | Admitting: Adult Health

## 2019-04-14 ENCOUNTER — Other Ambulatory Visit: Payer: Self-pay

## 2019-04-14 ENCOUNTER — Ambulatory Visit (INDEPENDENT_AMBULATORY_CARE_PROVIDER_SITE_OTHER): Payer: Medicaid Other | Admitting: Adult Health

## 2019-04-14 VITALS — BP 123/80 | HR 104 | Ht 65.0 in | Wt >= 6400 oz

## 2019-04-14 DIAGNOSIS — Z01419 Encounter for gynecological examination (general) (routine) without abnormal findings: Secondary | ICD-10-CM | POA: Insufficient documentation

## 2019-04-14 DIAGNOSIS — D500 Iron deficiency anemia secondary to blood loss (chronic): Secondary | ICD-10-CM

## 2019-04-14 DIAGNOSIS — Z8742 Personal history of other diseases of the female genital tract: Secondary | ICD-10-CM | POA: Insufficient documentation

## 2019-04-14 DIAGNOSIS — N950 Postmenopausal bleeding: Secondary | ICD-10-CM | POA: Insufficient documentation

## 2019-04-14 NOTE — Progress Notes (Signed)
Patient ID: Victoria Cummings, female   DOB: [birth year], 53 y.o.   MRN: [id number] History of Present Illness: Victoria Cummings is a 53 year old black female, G1P1000, single, referred by [facility] for heavy bleeding that is irregular and she had blood transfusion in December. She had been 13 months with no period, then lst February through August had irregular heavy bleeding, then noen til December then again in May for several weeks, she is on coumadin for PE and DVT. PCP is [facility].   Current Medications, Allergies, Past Medical History, Past Surgical History, Family History and Social History were reviewed in [employer] record.     Review of Systems: + heavy vaginal bleeding after no period for 13 months     Physical Exam:BP 123/80 (BP Location: Left Arm, Patient Position: Sitting, Cuff Size: Normal)   Pulse (!) 104   Ht 5' 5"  (1.651 m)   Wt (!) 411 lb 9.6 oz (186.7 kg)   BMI 68.49 kg/m  General:  Well developed, well nourished, no acute distress, walks with cane Skin:  Warm and dry Neck:  Midline trachea, normal thyroid, good ROM, no lymphadenopathy Lungs; Clear to auscultation bilaterally Cardiovascular: Regular rate and rhythm Pelvic:  External genitalia is normal in appearance, no lesions.  The vagina is normal in appearance. Urethra has no lesions or masses. The cervix is not visualized, and pap with HPV performed by blind sweep.  Uterus is felt to be normal size, shape, and contour.  No adnexal masses or tenderness noted.Bladder is non tender, no masses felt. Extremities: +swollen with lymph edema, L>R Psych:  No mood changes, alert and cooperative,seems happy Fall risk is low. PHQ 2 score 0. Examination chaperoned by Victoria Cummings Rash LPN.  Impression and Plan: -will check labs and get GYN [place] to assess uterus -US scheduled at [facility] 6/15 at 12:30 pm -will talk when labs back -return to office 6/17 to see me to reviwe [place] and treatment options, she is aware could be  polyp, increased endometrial tissue and possible endometrial cancer, may need biopsy. Face time 30 minutes with 50% counseling and coordinating care.   1. PMB (postmenopausal bleeding) - CBC - Follicle stimulating hormone - US PELVIS (TRANSABDOMINAL ONLY); Future - US PELVIS TRANSVANGINAL NON-OB (TV ONLY); Future  2. History of heavy vaginal bleeding - CBC - TSH - Follicle stimulating hormone - US PELVIS (TRANSABDOMINAL ONLY); Future - US PELVIS TRANSVANGINAL NON-OB (TV ONLY); Future  3. Iron deficiency anemia due to chronic blood loss - CBC - Iron, TIBC and Ferritin Panel  4. Encounter for gynecological examination with Papanicolaou smear of cervix - Cytology - PAP( [facility])

## 2019-04-15 LAB — CBC
Hematocrit: 36 % (ref 34.0–46.6)
Hemoglobin: 10.3 g/dL — ABNORMAL LOW (ref 11.1–15.9)
MCH: 21.4 pg — ABNORMAL LOW (ref 26.6–33.0)
MCHC: 28.6 g/dL — ABNORMAL LOW (ref 31.5–35.7)
MCV: 75 fL — ABNORMAL LOW (ref 79–97)
Platelets: 530 10*3/uL — ABNORMAL HIGH (ref 150–450)
RBC: 4.81 x10E6/uL (ref 3.77–5.28)
RDW: 21.6 % — ABNORMAL HIGH (ref 11.7–15.4)
WBC: 9.6 10*3/uL (ref 3.4–10.8)

## 2019-04-15 LAB — IRON,TIBC AND FERRITIN PANEL
Ferritin: 13 ng/mL — ABNORMAL LOW (ref 15–150)
Iron Saturation: 11 % — ABNORMAL LOW (ref 15–55)
Iron: 42 ug/dL (ref 27–159)
Total Iron Binding Capacity: 374 ug/dL (ref 250–450)
UIBC: 332 ug/dL (ref 131–425)

## 2019-04-15 LAB — TSH: TSH: 3.32 u[IU]/mL (ref 0.450–4.500)

## 2019-04-15 LAB — FOLLICLE STIMULATING HORMONE: FSH: 19.7 m[IU]/mL

## 2019-04-16 ENCOUNTER — Telehealth: Payer: Self-pay | Admitting: Adult Health

## 2019-04-16 LAB — CYTOLOGY - PAP
Diagnosis: NEGATIVE
HPV: NOT DETECTED

## 2019-04-16 NOTE — Telephone Encounter (Signed)
No VM  

## 2019-04-16 NOTE — Telephone Encounter (Signed)
Pt aware of labs and is taking iron bid already, will follow up next week

## 2019-04-20 ENCOUNTER — Other Ambulatory Visit: Payer: Self-pay

## 2019-04-20 ENCOUNTER — Ambulatory Visit (HOSPITAL_COMMUNITY)
Admission: RE | Admit: 2019-04-20 | Discharge: 2019-04-20 | Disposition: A | Discharge status: Home / Self Care | Payer: Medicaid Other | Source: Ambulatory Visit | Attending: Adult Health | Admitting: Adult Health

## 2019-04-20 DIAGNOSIS — N950 Postmenopausal bleeding: Secondary | ICD-10-CM | POA: Insufficient documentation

## 2019-04-20 DIAGNOSIS — Z8742 Personal history of other diseases of the female genital tract: Secondary | ICD-10-CM | POA: Insufficient documentation

## 2019-04-22 ENCOUNTER — Ambulatory Visit (INDEPENDENT_AMBULATORY_CARE_PROVIDER_SITE_OTHER): Payer: Medicaid Other | Admitting: Adult Health

## 2019-04-22 ENCOUNTER — Encounter: Payer: Self-pay | Admitting: Adult Health

## 2019-04-22 ENCOUNTER — Other Ambulatory Visit: Payer: Self-pay

## 2019-04-22 VITALS — BP 138/93 | HR 98 | Ht 65.0 in | Wt >= 6400 oz

## 2019-04-22 DIAGNOSIS — D500 Iron deficiency anemia secondary to blood loss (chronic): Secondary | ICD-10-CM | POA: Insufficient documentation

## 2019-04-22 DIAGNOSIS — Z8742 Personal history of other diseases of the female genital tract: Secondary | ICD-10-CM | POA: Diagnosis not present

## 2019-04-22 DIAGNOSIS — N950 Postmenopausal bleeding: Secondary | ICD-10-CM | POA: Diagnosis not present

## 2019-04-22 NOTE — Progress Notes (Signed)
Patient ID: Victoria Cummings, female   DOB: [birth year], 53 y.o.   MRN: [id number] History of Present Illness: Victoria Cummings is a 53 year old black female, back in follow on having pap, labs and [place] to assess heavy bleeding, presumed PMB. No current bleeding. PCP is [facility].   Current Medications, Allergies, Past Medical History, Past Surgical History, Family History and Social History were reviewed in [employer] record.     Review of Systems: No current bleeding    Physical Exam:BP (!) 138/93 (BP Location: Left Arm, Patient Position: Sitting, Cuff Size: Normal)   Pulse 98   Ht 5\' 5"  (1.651 m)   Wt (!) 409 lb 9.6 oz (185.8 kg)   BMI 68.16 kg/m no General:  Well developed, well nourished, no acute distress Skin:  Warm and dry Psych:  No mood changes, alert and cooperative,seems happy Reviewed pap which was negative for malignancy and HPV, and labs HBG 10.3,TSH 3.329, FSH 19.7, Ferritin 13 and iron sat 11, US showed small fibroid on left, and uterus measures 10.1 x 6.4 x 7.2 cm. Ovaries looked normal and endometrium not well visualized. Discussed with Dr Glo Herring and will get endometrial biopsy.    Impression: 1. History of heavy vaginal bleeding   2. PMB (postmenopausal bleeding)   3. Iron deficiency anemia due to chronic blood loss       Plan: Return in about 8 days for endometrial biopsy with Dr Glo Herring

## 2019-04-29 ENCOUNTER — Telehealth: Payer: Self-pay | Admitting: Family

## 2019-04-29 ENCOUNTER — Telehealth: Payer: Self-pay | Admitting: Obstetrics and Gynecology

## 2019-04-29 NOTE — Telephone Encounter (Signed)

## 2019-04-30 ENCOUNTER — Other Ambulatory Visit: Payer: Self-pay

## 2019-04-30 ENCOUNTER — Other Ambulatory Visit: Payer: Medicaid Other | Admitting: Obstetrics and Gynecology

## 2019-05-15 ENCOUNTER — Telehealth: Payer: Self-pay | Admitting: Obstetrics and Gynecology

## 2019-05-15 NOTE — Telephone Encounter (Signed)

## 2019-05-18 ENCOUNTER — Other Ambulatory Visit: Payer: Medicaid Other | Admitting: Obstetrics and Gynecology

## 2019-06-09 ENCOUNTER — Encounter: Payer: Self-pay | Admitting: Orthopaedic Surgery

## 2020-08-27 IMAGING — NM NM PULMONARY PERF PARTICULATE
3 series · 3 of 3 positions shown · non-contrast
Comparison: Chest radiograph from earlier today.

CLINICAL DATA: Left lower extremity lymphedema. Chest pain. Cough.
Morbid obesity.

EXAM:
NUCLEAR MEDICINE VENTILATION AND PERFUSION SCAN
TECHNIQUE: Perfusion images were obtained in multiple supine projections after
intravenous injection of radiopharmaceutical. Patient unable to
stand or lie prone. Ventilation images were attempted but could not
be performed due to patient limitations. Patient unable to keep
nebulizer in mouth.
RADIOPHARMACEUTICALS:  4 mCi 2c33m MAA-IV

[vq lung scan · 2.26mm/px · 1 of 1 slices shown (1 of 3)]
[im 1/1]
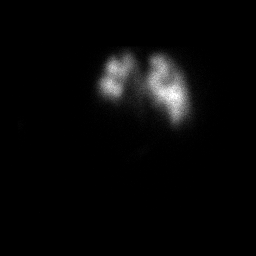

[vq lung scan · 2.26mm/px · 1 of 1 slices shown (2 of 3)]
[im 1/1]
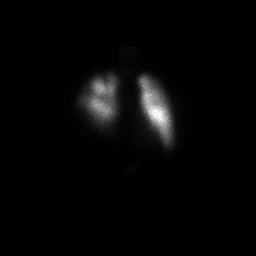

[vq lung scan · 2.26mm/px · 1 of 1 slices shown (3 of 3)]
[im 1/1]
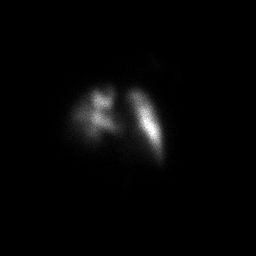

[3 of 3 positions shown; findings below may reference images not displayed]

FINDINGS: Ventilation: Not performed due to patient limitations.

Perfusion: There are multiple moderate segmental perfusion defects
in the upper lungs bilaterally (at least two in each lung).
IMPRESSION: Intermediate probability for pulmonary embolism. Multiple moderate
segmental perfusion defects in the upper lungs bilaterally.
Ventilation imaging could not be performed due to patient
limitations.

These results were called by telephone at the time of interpretation
on 10/08/2018 at [DATE] to Dr. XIANGQIAN SORNOZA , who verbally
acknowledged these results.

## 2021-03-09 IMAGING — US US PELVIS COMPLETE
1 series · 14 of 25 positions shown · non-contrast
Comparison: None.

CLINICAL DATA: Postmenopausal bleeding.

EXAM:
TRANSABDOMINAL ULTRASOUND OF PELVIS
TECHNIQUE: Transabdominal ultrasound examination of the pelvis was performed
including evaluation of the uterus, ovaries, adnexal regions, and
pelvic cul-de-sac. Transvaginal sonography was attempted, however
was unsuccessful as patient could not keep feet in stirrups on exam
table.

[Series 1: us pelvis complete · 14 of 52 slices shown]
[im 1/52]
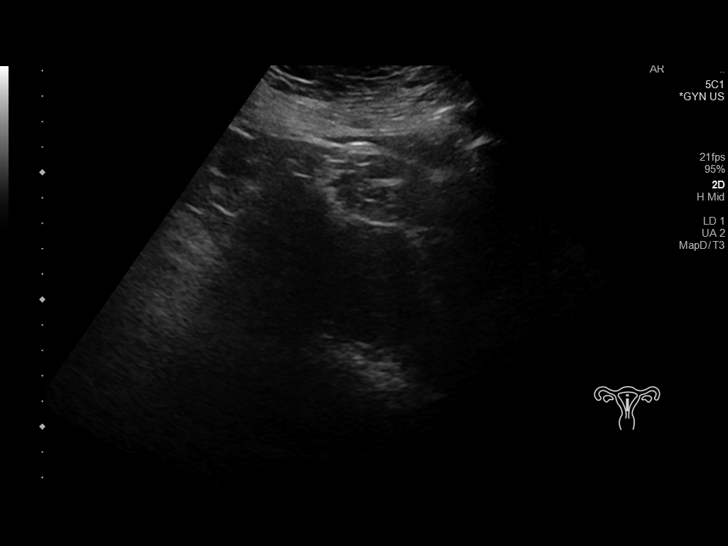
[im 5/52]
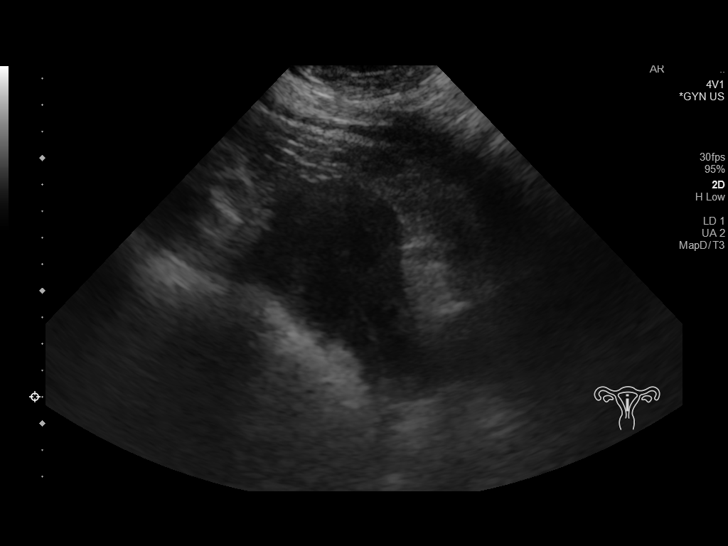
[im 9/52]
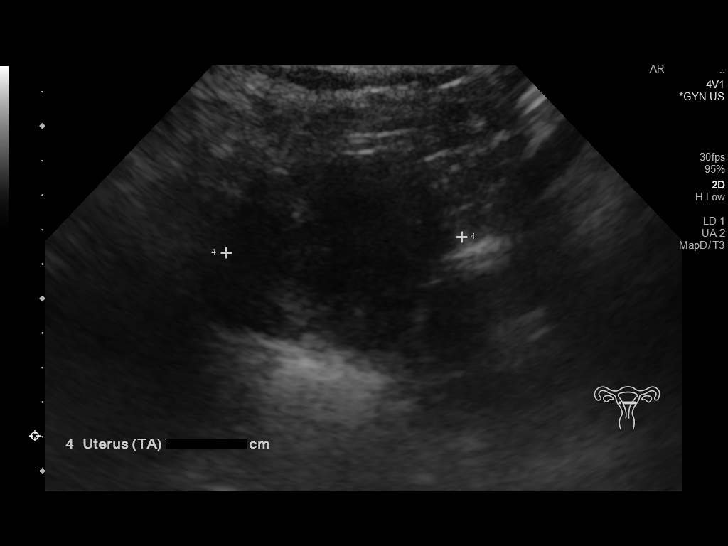
[im 13/52]
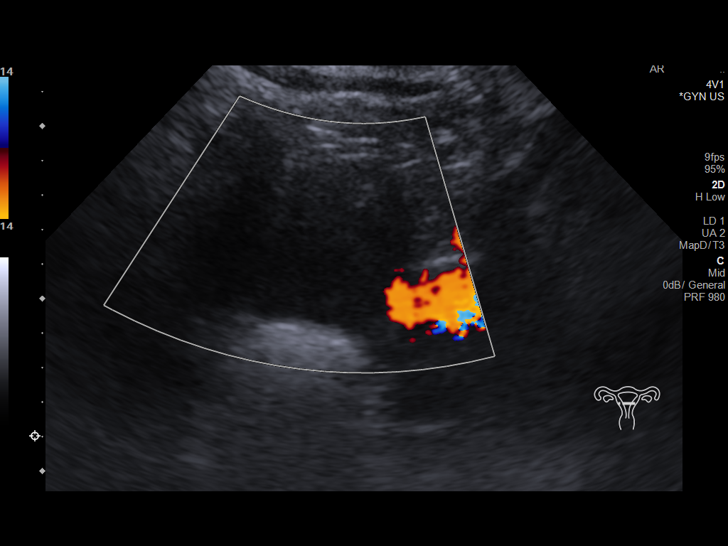
[im 18/52]
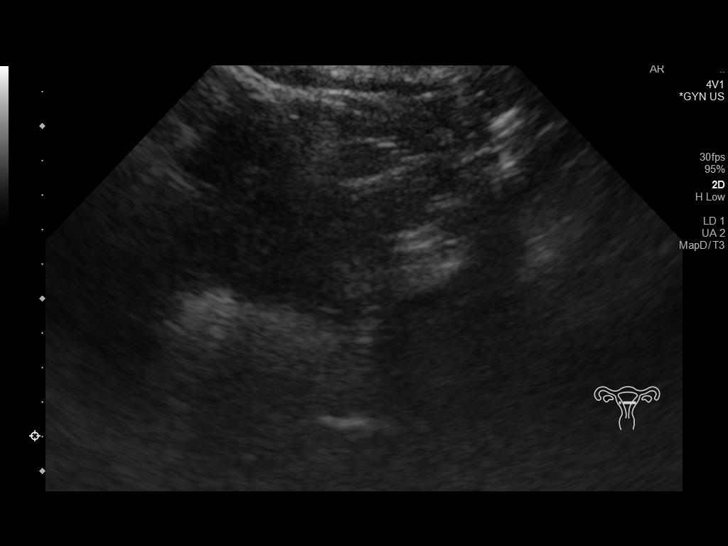
[im 20/52]
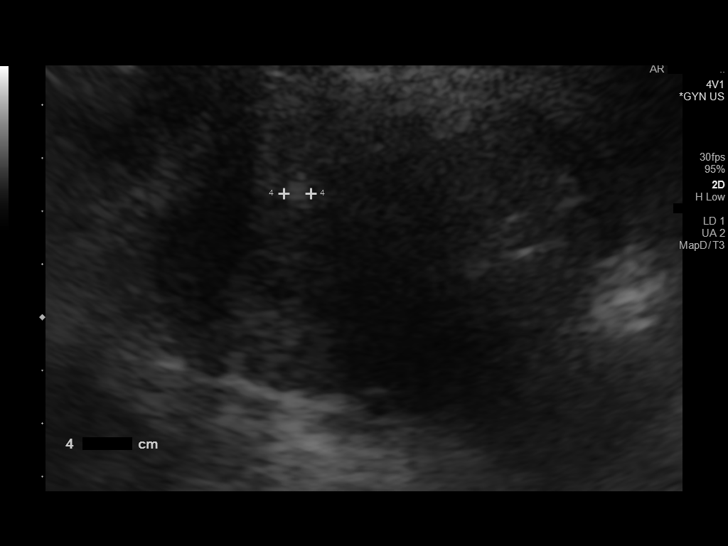
[im 24/52]
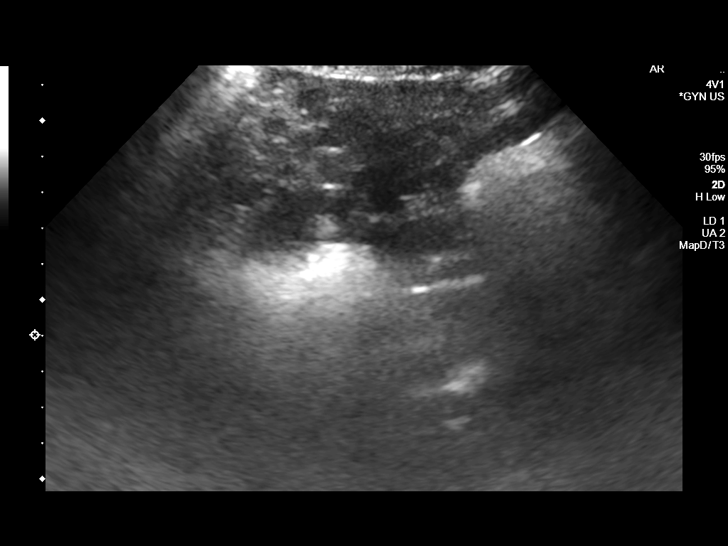
[im 28/52]
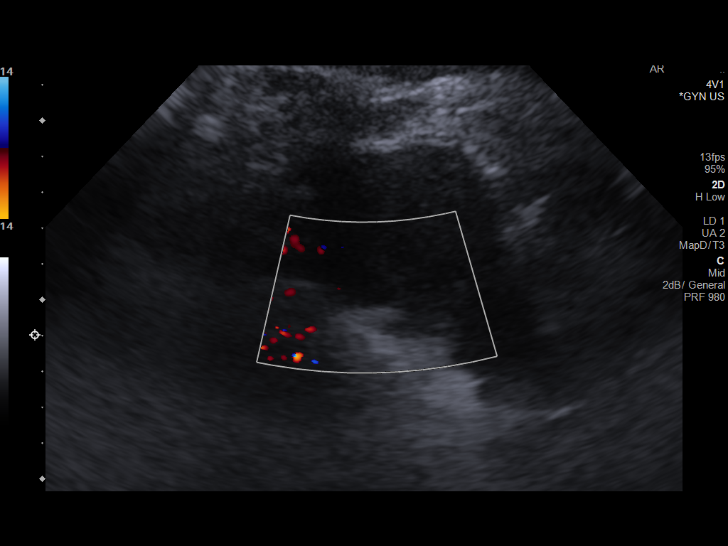
[im 32/52]
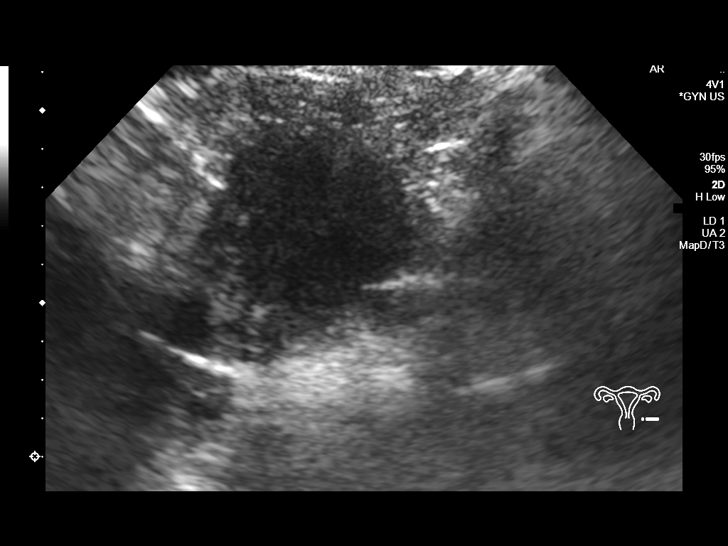
[im 35/52]
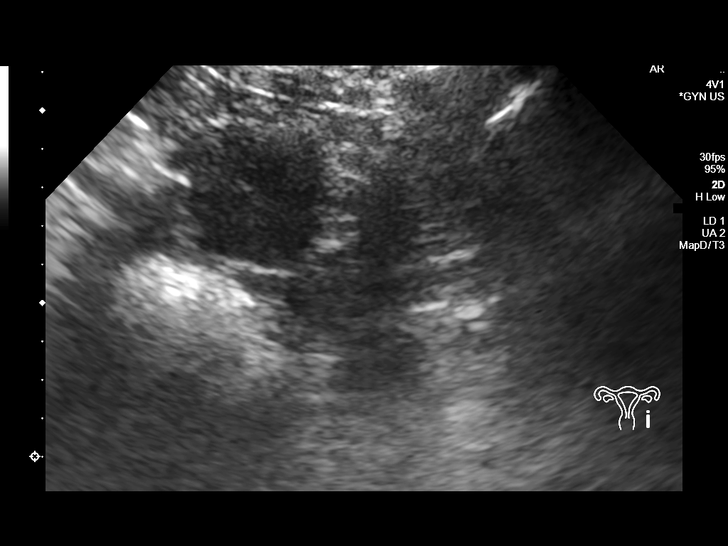
[im 39/52]
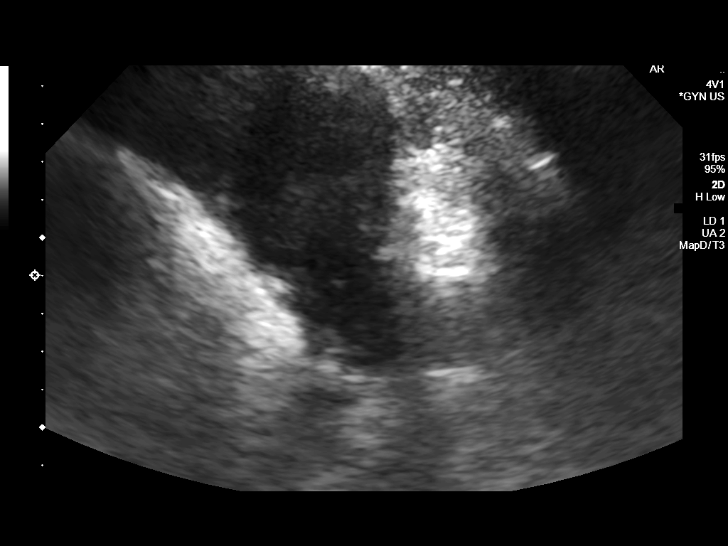
[im 43/52]
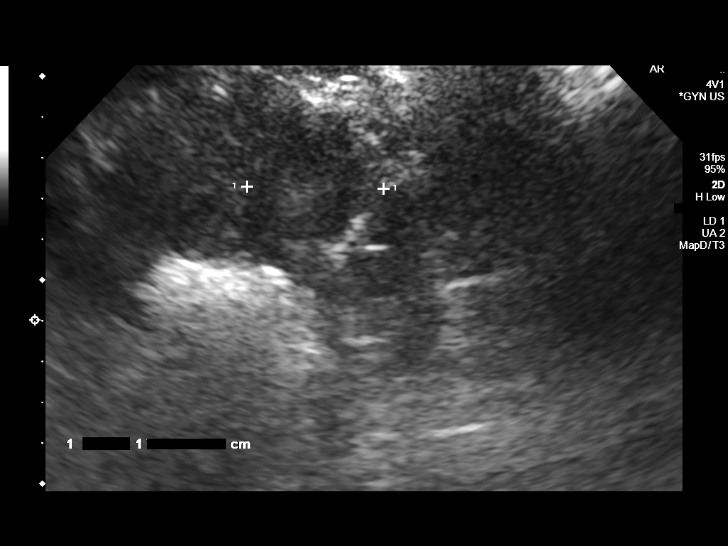
[im 47/52]
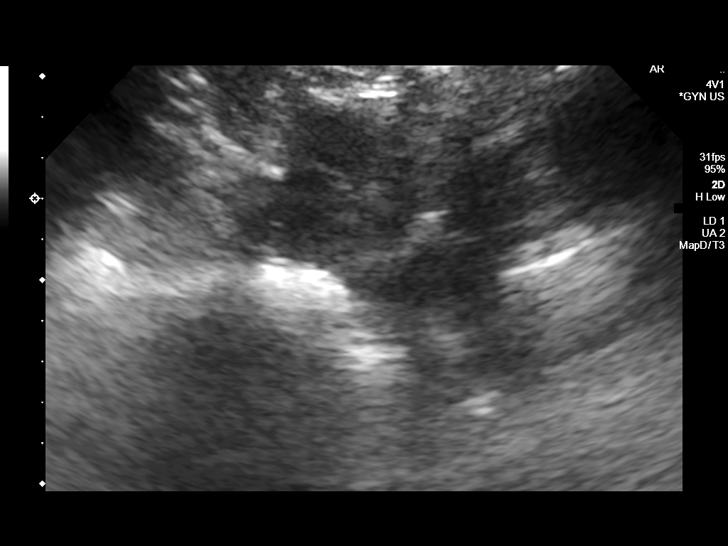
[im 52/52]
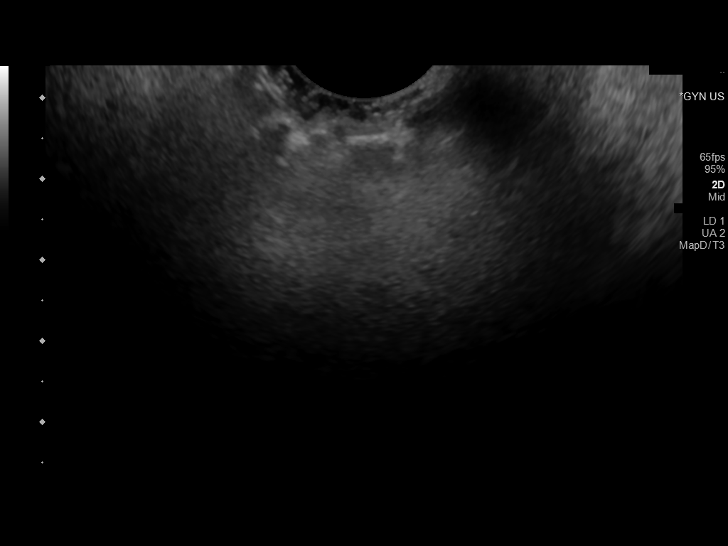

[14 of 25 positions shown; findings below may reference images not displayed]

FINDINGS: Uterus

Measurements: 10.1 x 6.4 x 7.2 cm = volume: 243 mL. A small left
sided fibroid is seen measuring 3.4 cm in maximum diameter. No other
definite fibroids are identified.

Endometrium

Thickness: Not well visualized by transabdominal sonography.

Right ovary

Measurements: 2.1 x 1.2 x 1.7 cm = volume: 2.1 mL. Normal
appearance/no adnexal mass.

Left ovary

Measurements: 2.4 x 1.9 x 2.3 cm = volume: 5.4 mL. Normal
appearance/no adnexal mass.

Other findings:  No abnormal free fluid.
IMPRESSION: Small left uterine fibroid measuring 3.4 cm. Endometrium could not
be visualized due to patient's inability to undergo transvaginal
sonography.

Normal appearance of both ovaries.  No adnexal mass identified.

## 2024-04-08 ENCOUNTER — Other Ambulatory Visit: Payer: Self-pay

## 2024-04-08 ENCOUNTER — Encounter (HOSPITAL_COMMUNITY): Payer: Self-pay | Admitting: Physical Therapy

## 2024-04-08 ENCOUNTER — Ambulatory Visit (HOSPITAL_COMMUNITY): Attending: Family Medicine | Admitting: Physical Therapy

## 2024-04-08 DIAGNOSIS — I890 Lymphedema, not elsewhere classified: Secondary | ICD-10-CM | POA: Diagnosis present

## 2024-04-08 NOTE — Therapy (Addendum)
 OUTPATIENT PHYSICAL THERAPY LYMPHEDEMA EVALUATION  Patient Name: Victoria Cummings MRN: [id number] DOB:[birth year], 58 y.o., female Today's Date: 04/08/2024  END OF SESSION:  PT End of Session - 04/08/24 1544     Visit Number 1    Number of Visits 24    Date for PT Re-Evaluation 06/03/24    Authorization Type medicare    Progress Note Due on Visit 10    PT Start Time 1015    PT Stop Time 1117    PT Time Calculation (min) 62 min    Activity Tolerance Patient tolerated treatment well             Past Medical History:  Diagnosis Date   Anemia    Bronchitis    DVT (deep venous thrombosis) (HCC)    Hyperlipidemia    Hypertension    Lymph edema    Morbid obesity (HCC)    Pneumonia    History reviewed. No pertinent surgical history. Patient Active Problem List   Diagnosis Date Noted   Iron deficiency anemia due to chronic blood loss 04/22/2019   History of heavy vaginal bleeding 04/14/2019   PMB (postmenopausal bleeding) 04/14/2019   Encounter for gynecological examination with Papanicolaou smear of cervix 04/14/2019   Acute pulmonary embolism (HCC)    Anemia    DVT (deep venous thrombosis) (HCC) 10/08/2018    PCP: [facility]  REFERRING PROVIDER: Bucio, Elsa C, FNP  REFERRING DIAG: lymphedema  THERAPY DIAG:  lymphedema  Rationale for Evaluation and Treatment: Rehabilitation  ONSET DATE: Since she was 14 primary lymphedema  SUBJECTIVE:                                                                                                                                                                                           SUBJECTIVE STATEMENT:  Pt states that this is the third time that she has been through lymphedema therapy.  At this time she is not completing any exercises, nor has she been shown any exercises.  PT has short stretch compression bandages at home but did not bring them.  Pt has a compression pump at home but has not been using it.    PERTINENT HISTORY:  morbid obesity, HTN, anemia, remote hx of DVT  PAIN:  Are you having pain? Yes not from the lymphedema from OA we will not be addressing.  NPRS scale: 5/10 Pain location: B knees PRECAUTIONS: Other: cellulitis    WEIGHT BEARING RESTRICTIONS: No  FALLS:  Has patient fallen in last 6 months? No  LIVING ENVIRONMENT: Lives with: lives alone Lives in: House/apartment Has following equipment at home: Single point cane  OCCUPATION: not employed   PATIENT GOALS:  I want my legs to get smaller and to walk better.     OBJECTIVE: Note: Objective measures were completed at Evaluation unless otherwise noted.  COGNITION: Overall cognitive status: Within functional limits for tasks assessed   PALPATION: Noted induration   OBSERVATIONS / OTHER ASSESSMENTS: noted    LYMPHEDEMA ASSESSMENTS:     LE LANDMARK RIGHT-sitting eval  At groin   30 cm proximal to suprapatella   20 cm proximal to suprapatella 106.5  10 cm proximal to suprapatella 104.5  At midpatella / popliteal crease 61.5 under lobe  30 cm proximal to floor at lateral plantar foot 87  20 cm proximal to floor at lateral plantar foot 75  10 cm proximal to floor at lateral plantar foot 64.9  Circumference of ankle/heel 41.5  5 cm proximal to 1st MTP joint 33  Across MTP joint 28.8  Around proximal great toe   (Blank rows = not tested)  LE LANDMARK LEFT eval  At groin   30 cm proximal to suprapatella   20 cm proximal to suprapatella 147  10 cm proximal to suprapatella 129.8  At midpatella / popliteal crease 75  30 cm proximal to floor at lateral plantar foot 73.5  20 cm proximal to floor at lateral plantar foot 78.5  10 cm proximal to floor at lateral plantar foot 75.8  Circumference of ankle/heel 42  5 cm proximal to 1st MTP joint 32.8  Across MTP joint 27.8  Around proximal great toe   (Blank rows = not tested)  FUNCTIONAL TESTS:   30 second sit to stand: 4 1:30 minute walk test:  76 feet.  Pt uses a cane and had to rest once.      TODAY'S TREATMENT:                                                                                                                              DATE: 04/08/24:  Evaluation Sitting:   Ankle pumps, Long arc quad, hip ab/adduction, marching in place, diaphragm breathing.   PATIENT EDUCATION:  Education details: four aspects to treating lymphedema to include 1)Skin care, 2)exercise, 3)manual, 4) compression .   Person educated: Patient Education method: Explanation, Verbal cues, and Handouts Education comprehension: verbalized understanding  HOME EXERCISE PROGRAM: Ankle pumps, Long arc quad, hip ab/adduction, marching in place, diaphragm breathing.    ASSESSMENT:  CLINICAL IMPRESSION: Patient is a 58 y.o. female who was seen today for physical therapy evaluation and treatment for B Le Lymphedema .   Therapist encouraged pt to begin using her pump again.  Pt educated patient in exercises that she can do to increase her lymphatic circulation and gave her a HEP.  Therapist requested pt to return with her short stretch bandages to that we could begin total decongestive techniques which pt is in need of to decrease her edema, improve the integrity of her skin, improve her mobility and decrease  her risk of cellulitis.    OBJECTIVE IMPAIRMENTS: decreased activity tolerance, decreased mobility, difficulty walking, increased edema, and obesity.   ACTIVITY LIMITATIONS: bathing, toileting, dressing, hygiene/grooming, and locomotion level  PARTICIPATION LIMITATIONS: cleaning, shopping, community activity, and occupation  PERSONAL FACTORS: Fitness, Time since onset of injury/illness/exacerbation, and 1 comorbidity: obesity are also affecting patient's functional outcome.   REHAB POTENTIAL: Good  CLINICAL DECISION MAKING: Evolving/moderate complexity  EVALUATION COMPLEXITY: Moderate  GOALS: Goals reviewed with patient? No  SHORT TERM GOALS:  Target date: 05/06/24  PT to be I in skin care to decrease the risk of cellulitis Baseline: Goal status: INITIAL  2.  Pt to be I in HEP in order to improve lymphatic circulation Baseline:  Goal status: INITIAL  3.  Pt skin integrity to have improved noted by skin no longer flaking off Baseline:  Goal status: INITIAL  4.  PT to have lost 4 cm from LE measurement to decrease risk of cellulitis  Baseline:  Goal status: INITIAL   LONG TERM GOALS: Target date: 06/03/24  Pt to be I in donning a trim to fit compression garment Baseline:  Goal status: INITIAL  2.  PT to be using her pump Baseline:  Goal status: INITIAL  3.  PT to have lost 10 cm or more from LE measurement to allow improved mobilityu Baseline:  Goal status: INITIAL  4.  Pt to be able to complete 8 sit to stand in 30 minutes.   Baseline:  Goal status: INITIAL   PLAN:  PT FREQUENCY: 3x/week  PT DURATION: 6 weeks  PLANNED INTERVENTIONS: 97110-Therapeutic exercises, 97535- Self Care, and 02859- Manual therapy  PLAN FOR NEXT SESSION: cut foam for B LE, begin manual with education on self manual.   Montie Metro, PT CLT [phone]  04/08/2024, 3:47 PM

## 2024-04-10 ENCOUNTER — Ambulatory Visit (HOSPITAL_COMMUNITY): Admitting: Physical Therapy

## 2024-04-10 DIAGNOSIS — I890 Lymphedema, not elsewhere classified: Secondary | ICD-10-CM | POA: Diagnosis not present

## 2024-04-10 NOTE — Therapy (Addendum)
 / OUTPATIENT PHYSICAL THERAPY LYMPHEDEMA Treatment   Patient Name: Victoria Cummings MRN: [id number] DOB:May 02, 1966, 58 y.o., female Today's Date: 04/10/2024  END OF SESSION:  PT End of Session - 04/10/24 1404     Visit Number 2    Number of Visits 24    Date for PT Re-Evaluation 06/03/24    Authorization Type medicare    PT Start Time 1247    PT Stop Time 1348    PT Time Calculation (min) 61 min    Activity Tolerance Patient tolerated treatment well             Past Medical History:  Diagnosis Date   Anemia    Bronchitis    DVT (deep venous thrombosis) (HCC)    Hyperlipidemia    Hypertension    Lymph edema    Morbid obesity (HCC)    Pneumonia    No past surgical history on file. Patient Active Problem List   Diagnosis Date Noted   Iron deficiency anemia due to chronic blood loss 04/22/2019   History of heavy vaginal bleeding 04/14/2019   PMB (postmenopausal bleeding) 04/14/2019   Encounter for gynecological examination with Papanicolaou smear of cervix 04/14/2019   Acute pulmonary embolism (HCC)    Anemia    DVT (deep venous thrombosis) (HCC) 10/08/2018    PCP: [facility]  REFERRING PROVIDER: Bucio, Silvio BROCKS, FNP  REFERRING DIAG: lymphedema  THERAPY DIAG:  lymphedema  Rationale for Evaluation and Treatment: Rehabilitation  ONSET DATE: Since she was 14 primary lymphedema  SUBJECTIVE:                                                                                                                                                                                           SUBJECTIVE STATEMENT:  Pt states that she did her exercises this morning  PERTINENT HISTORY:  morbid obesity, HTN, anemia, remote hx of DVT  PAIN:  Are you having pain? Yes not from the lymphedema from OA we will not be addressing.  NPRS scale: 5/10 Pain location: B knees PRECAUTIONS: Other: cellulitis    WEIGHT BEARING RESTRICTIONS: No  FALLS:  Has patient fallen in  last 6 months? No  LIVING ENVIRONMENT: Lives with: lives alone Lives in: House/apartment Has following equipment at home: Single point cane  OCCUPATION: not employed   PATIENT GOALS:  I want my legs to get smaller and to walk better.     OBJECTIVE: Note: Objective measures were completed at Evaluation unless otherwise noted.  COGNITION: Overall cognitive status: Within functional limits for tasks assessed   PALPATION: Noted induration   OBSERVATIONS / OTHER ASSESSMENTS: noted  LYMPHEDEMA ASSESSMENTS:     LE LANDMARK RIGHT-sitting eval  At groin   30 cm proximal to suprapatella   20 cm proximal to suprapatella 106.5  10 cm proximal to suprapatella 104.5  At midpatella / popliteal crease 61.5 under lobe  30 cm proximal to floor at lateral plantar foot 87  20 cm proximal to floor at lateral plantar foot 75  10 cm proximal to floor at lateral plantar foot 64.9  Circumference of ankle/heel 41.5  5 cm proximal to 1st MTP joint 33  Across MTP joint 28.8  Around proximal great toe   (Blank rows = not tested)  LE LANDMARK LEFT eval  At groin   30 cm proximal to suprapatella   20 cm proximal to suprapatella 147  10 cm proximal to suprapatella 129.8  At midpatella / popliteal crease 75  30 cm proximal to floor at lateral plantar foot 73.5  20 cm proximal to floor at lateral plantar foot 78.5  10 cm proximal to floor at lateral plantar foot 75.8  Circumference of ankle/heel 42  5 cm proximal to 1st MTP joint 32.8  Across MTP joint 27.8  Around proximal great toe   (Blank rows = not tested)  FUNCTIONAL TESTS:   30 second sit to stand: 4 1:30 minute walk test: 76 feet.  Pt uses a cane and had to rest once.      TODAY'S TREATMENT:                                                                                                                                     DATE: 04/10/24:  Therapist cleansed and moisturized B LE.   Manual to include short neck, axillary  nodes, deep and superficial abdominal axillary/inguinal anastomosis followed by Lt LE due to time constraints RT LE was not completed.   Compression bandaging from toes to knee B using multilayer short stretch bandaging and 1/2 foam.      PATIENT EDUCATION: 04/10/24:  The benefits of completing self manual specifically working on moving fluid from inner to outer thigh . Education details: four aspects to treating lymphedema to include 1)Skin care, 2)exercise, 3)manual, 4) compression .   Person educated: Patient Education method: Explanation, Verbal cues, and Handouts Education comprehension: verbalized understanding  HOME EXERCISE PROGRAM: Ankle pumps, Long arc quad, hip ab/adduction, marching in place, diaphragm breathing.    ASSESSMENT:  CLINICAL IMPRESSION: Significant skin sloughed off pt during the cleansing of the LE's.   Pt brought all the short stretch and foam that was being used by the last clinic, which was short four 10 cm  bandages.  Pt will qualify for [facility] grant for bandages therefore therapist added enough bandages to properly bandage pt.  PT verbalized comfort at end of session.     OBJECTIVE IMPAIRMENTS: decreased activity tolerance, decreased mobility, difficulty walking, increased edema, and obesity.   ACTIVITY LIMITATIONS: bathing, toileting, dressing, hygiene/grooming, and  locomotion level  PARTICIPATION LIMITATIONS: cleaning, shopping, community activity, and occupation  PERSONAL FACTORS: Fitness, Time since onset of injury/illness/exacerbation, and 1 comorbidity: obesity are also affecting patient's functional outcome.   REHAB POTENTIAL: Good  CLINICAL DECISION MAKING: Evolving/moderate complexity  EVALUATION COMPLEXITY: Moderate  GOALS: Goals reviewed with patient? No  SHORT TERM GOALS: Target date: 05/06/24  PT to be I in skin care to decrease the risk of cellulitis Baseline: Goal status: on-going   2.  Pt to be I in HEP in order to improve lymphatic  circulation Baseline:  Goal status: on-going  3.  Pt skin integrity to have improved noted by skin no longer flaking off Baseline:  Goal status: on-going  4.  PT to have lost 4 cm from LE measurement to decrease risk of cellulitis  Baseline:  Goal status:on-going   LONG TERM GOALS: Target date: 06/03/24  Pt to be I in donning a trim to fit compression garment Baseline:  Goal status: on-going  2.  PT to be using her pump Baseline:  Goal status:on-going  3.  PT to have lost 10 cm or more from LE measurement to allow improved mobilityu Baseline:  Goal status: on-going  4.  Pt to be able to complete 8 sit to stand in 30 minutes.   Baseline:  Goal status: on-going   PLAN:  PT FREQUENCY: 3x/week  PT DURATION: 6 weeks  PLANNED INTERVENTIONS: 97110-Therapeutic exercises, 97535- Self Care, and 02859- Manual therapy  PLAN FOR NEXT SESSION: education on self manual give sheet.   Montie Metro, PT CLT [phone]  04/10/2024, 2:05 PM

## 2024-04-13 ENCOUNTER — Encounter (HOSPITAL_COMMUNITY): Payer: Self-pay

## 2024-04-13 ENCOUNTER — Ambulatory Visit (HOSPITAL_COMMUNITY): Payer: Self-pay

## 2024-04-13 DIAGNOSIS — I890 Lymphedema, not elsewhere classified: Secondary | ICD-10-CM | POA: Diagnosis not present

## 2024-04-13 NOTE — Therapy (Signed)
 / OUTPATIENT PHYSICAL THERAPY LYMPHEDEMA Treatment   Patient Name: Victoria Cummings MRN: [id number] DOB:08/27/1966, 58 y.o., female Today's Date: 04/13/2024  END OF SESSION:  PT End of Session - 04/13/24 1140     Visit Number 3    Number of Visits 24    Date for PT Re-Evaluation 06/03/24    Authorization Type medicare    Progress Note Due on Visit 10    PT Start Time 1010    PT Stop Time 1136    PT Time Calculation (min) 86 min    Activity Tolerance Patient tolerated treatment well    Behavior During Therapy WFL for tasks assessed/performed             Past Medical History:  Diagnosis Date   Anemia    Bronchitis    DVT (deep venous thrombosis) (HCC)    Hyperlipidemia    Hypertension    Lymph edema    Morbid obesity (HCC)    Pneumonia    History reviewed. No pertinent surgical history. Patient Active Problem List   Diagnosis Date Noted   Iron deficiency anemia due to chronic blood loss 04/22/2019   History of heavy vaginal bleeding 04/14/2019   PMB (postmenopausal bleeding) 04/14/2019   Encounter for gynecological examination with Papanicolaou smear of cervix 04/14/2019   Acute pulmonary embolism (HCC)    Anemia    DVT (deep venous thrombosis) (HCC) 10/08/2018    PCP: [facility]  REFERRING PROVIDER: Bucio, Melodi Sprung, FNP  REFERRING DIAG: lymphedema  THERAPY DIAG:  lymphedema  Rationale for Evaluation and Treatment: Rehabilitation  ONSET DATE: Since she was 14 primary lymphedema  SUBJECTIVE:                                                                                                                                                                                           SUBJECTIVE STATEMENT:  Pt stated the dressings were too tight, had to remove on Saturday.  PERTINENT HISTORY:  morbid obesity, HTN, anemia, remote hx of DVT  PAIN:  Are you having pain? Yes not from the lymphedema from OA we will not be addressing.  NPRS scale:  5/10 Pain location: B knees PRECAUTIONS: Other: cellulitis    WEIGHT BEARING RESTRICTIONS: No  FALLS:  Has patient fallen in last 6 months? No  LIVING ENVIRONMENT: Lives with: lives alone Lives in: House/apartment Has following equipment at home: Single point cane  OCCUPATION: not employed   PATIENT GOALS:  I want my legs to get smaller and to walk better.     OBJECTIVE: Note: Objective measures were completed at Evaluation unless otherwise noted.  COGNITION:  Overall cognitive status: Within functional limits for tasks assessed   PALPATION: Noted induration   OBSERVATIONS / OTHER ASSESSMENTS: noted    LYMPHEDEMA ASSESSMENTS:     LE LANDMARK RIGHT-sitting eval  At groin   30 cm proximal to suprapatella   20 cm proximal to suprapatella 106.5  10 cm proximal to suprapatella 104.5  At midpatella / popliteal crease 61.5 under lobe  30 cm proximal to floor at lateral plantar foot 87  20 cm proximal to floor at lateral plantar foot 75  10 cm proximal to floor at lateral plantar foot 64.9  Circumference of ankle/heel 41.5  5 cm proximal to 1st MTP joint 33  Across MTP joint 28.8  Around proximal great toe   (Blank rows = not tested)  LE LANDMARK LEFT eval  At groin   30 cm proximal to suprapatella   20 cm proximal to suprapatella 147  10 cm proximal to suprapatella 129.8  At midpatella / popliteal crease 69  30 cm proximal to floor at lateral plantar foot 43.5  20 cm proximal to floor at lateral plantar foot 78.5  10 cm proximal to floor at lateral plantar foot 75.8  Circumference of ankle/heel 42  5 cm proximal to 1st MTP joint 32.8  Across MTP joint 27.8  Around proximal great toe   (Blank rows = not tested)  FUNCTIONAL TESTS:   30 second sit to stand: 4 1:30 minute walk test: 76 feet.  Pt uses a cane and had to rest once.      TODAY'S TREATMENT:                                                                                                                                      DATE: 04/10/24:  Therapist cleansed and moisturized B LE.   Manual to include short neck, axillary nodes, deep and superficial abdominal axillary/inguinal anastomosis followed by Lt LE due to time constraints RT LE was not completed.   Compression bandaging from toes to knee B using multilayer short stretch bandaging and 1/2" foam.      PATIENT EDUCATION: 04/10/24:  The benefits of completing self manual specifically working on moving fluid from inner to outer thigh . Education details: four aspects to treating lymphedema to include 1)Skin care, 2)exercise, 3)manual, 4) compression .   Person educated: Patient Education method: Explanation, Verbal cues, and Handouts Education comprehension: verbalized understanding  HOME EXERCISE PROGRAM: Ankle pumps, Long arc quad, hip ab/adduction, marching in place, diaphragm breathing.    ASSESSMENT:  CLINICAL IMPRESSION:  Manual decompression complete for inguinal to axillary anastomosis anterior only with increased focus on medial thigh due to induration present.  Educated self manual and handout given.  Significant skin sloughed off pt.  Heavy application of lotion to address dry skin prior application of multilayer short stretch bandages.  Pt brought in tax information and signed the acknowledgement form for [employer] financial  assistance, given to supervisor.  Reports of comfort at [facility].      OBJECTIVE IMPAIRMENTS: decreased activity tolerance, decreased mobility, difficulty walking, increased edema, and obesity.   ACTIVITY LIMITATIONS: bathing, toileting, dressing, hygiene/grooming, and locomotion level  PARTICIPATION LIMITATIONS: cleaning, shopping, community activity, and occupation  PERSONAL FACTORS: Fitness, Time since onset of injury/illness/exacerbation, and 1 comorbidity: obesity are also affecting patient's functional outcome.   REHAB POTENTIAL: Good  CLINICAL DECISION MAKING: Evolving/moderate  complexity  EVALUATION COMPLEXITY: Moderate  GOALS: Goals reviewed with patient? No  SHORT TERM GOALS: Target date: 05/06/24  PT to be I in skin care to decrease the risk of cellulitis Baseline: Goal status: on-going   2.  Pt to be I in HEP in order to improve lymphatic circulation Baseline:  Goal status: on-going  3.  Pt skin integrity to have improved noted by skin no longer flaking off Baseline:  Goal status: on-going  4.  PT to have lost 4 cm from LE measurement to decrease risk of cellulitis  Baseline:  Goal status:on-going   LONG TERM GOALS: Target date: 06/03/24  Pt to be I in donning a trim to fit compression garment Baseline:  Goal status: on-going  2.  PT to be using her pump Baseline:  Goal status:on-going  3.  PT to have lost 10 cm or more from LE measurement to allow improved mobilityu Baseline:  Goal status: on-going  4.  Pt to be able to complete 8 sit to stand in 30 minutes.   Baseline:  Goal status: on-going   PLAN:  PT FREQUENCY: 3x/week  PT DURATION: 6 weeks  PLANNED INTERVENTIONS: 97110-Therapeutic exercises, 97535- Self Care, and 16109- Manual therapy  PLAN FOR NEXT SESSION: Review self manual.  Begin thighs when supplies come in.  Minor Amble, LPTA/CLT; Johnye Napoleon [phone]  04/13/2024, 11:41 AM

## 2024-04-15 ENCOUNTER — Ambulatory Visit (HOSPITAL_COMMUNITY): Payer: Self-pay | Admitting: Physical Therapy

## 2024-04-15 DIAGNOSIS — I890 Lymphedema, not elsewhere classified: Secondary | ICD-10-CM

## 2024-04-15 NOTE — Therapy (Addendum)
 / OUTPATIENT PHYSICAL THERAPY LYMPHEDEMA Treatment   Patient Name: Victoria Cummings MRN: [id number] DOB:06/22/1966, 58 y.o., female Today's Date: 04/15/2024  END OF SESSION:  PT End of Session - 04/15/24 1150     Visit Number 4    Number of Visits 24    Date for PT Re-Evaluation 06/03/24    Authorization Type medicare    Progress Note Due on Visit 10    PT Start Time 1015    PT Stop Time 1140    PT Time Calculation (min) 85 min    Activity Tolerance Patient tolerated treatment well    Behavior During Therapy WFL for tasks assessed/performed              Past Medical History:  Diagnosis Date   Anemia    Bronchitis    DVT (deep venous thrombosis) (HCC)    Hyperlipidemia    Hypertension    Lymph edema    Morbid obesity (HCC)    Pneumonia    No past surgical history on file. Patient Active Problem List   Diagnosis Date Noted   Iron deficiency anemia due to chronic blood loss 04/22/2019   History of heavy vaginal bleeding 04/14/2019   PMB (postmenopausal bleeding) 04/14/2019   Encounter for gynecological examination with Papanicolaou smear of cervix 04/14/2019   Acute pulmonary embolism (HCC)    Anemia    DVT (deep venous thrombosis) (HCC) 10/08/2018    [facility]  REFERRING PROVIDER: Bucio, Silvio BROCKS, FNP  REFERRING DIAG: lymphedema  THERAPY DIAG:  lymphedema  Rationale for Evaluation and Treatment: Rehabilitation  ONSET DATE: Since she was 14 primary lymphedema  SUBJECTIVE:                                                                                                                                                                                           SUBJECTIVE STATEMENT:  Pt states the exercises are still very difficult to do   PERTINENT HISTORY:  morbid obesity, HTN, anemia, remote hx of DVT  PAIN:  Are you having pain? Yes not from the lymphedema from OA we will not be addressing.  NPRS scale: 5/10 Pain location: B  knees PRECAUTIONS: Other: cellulitis    WEIGHT BEARING RESTRICTIONS: No  FALLS:  Has patient fallen in last 6 months? No  LIVING ENVIRONMENT: Lives with: lives alone Lives in: House/apartment Has following equipment at home: Single point cane  OCCUPATION: not employed   PATIENT GOALS:  I want my legs to get smaller and to walk better.     OBJECTIVE: Note: Objective measures were completed at Evaluation unless otherwise noted.  COGNITION:  Overall cognitive status: Within functional limits for tasks assessed   PALPATION: Noted induration   OBSERVATIONS / OTHER ASSESSMENTS: noted    LYMPHEDEMA ASSESSMENTS:     LE LANDMARK RIGHT-sitting eval 04/15/24  At groin    30 cm proximal to suprapatella    20 cm proximal to suprapatella 106.5 105  10 cm proximal to suprapatella 104.5 104.3  At midpatella / popliteal crease 61.5 under lobe 59  30 cm proximal to floor at lateral plantar foot 87 84.5  20 cm proximal to floor at lateral plantar foot 75 75  10 cm proximal to floor at lateral plantar foot 64.9 63.2  Circumference of ankle/heel 41.5 42  5 cm proximal to 1st MTP joint 33 31.5  Across MTP joint 28.8 31  Around proximal great toe    (Blank rows = not tested)  LE LANDMARK LEFT eval 04/15/24  At groin    30 cm proximal to suprapatella    20 cm proximal to suprapatella 147 140  10 cm proximal to suprapatella 129.8 129.5  At midpatella / popliteal crease 79 79  30 cm proximal to floor at lateral plantar foot 73.5 58  20 cm proximal to floor at lateral plantar foot 78.5 71  10 cm proximal to floor at lateral plantar foot 75.8 62  Circumference of ankle/heel 42 41.2  5 cm proximal to 1st MTP joint 32.8 32.2  Across MTP joint 27.8 28.3  Around proximal great toe    (Blank rows = not tested)  FUNCTIONAL TESTS:   30 second sit to stand: 4 1:30 minute walk test: 76 feet.  Pt uses a cane and had to rest once.      TODAY'S TREATMENT:                                                                                                                                      DATE: 04/15/24:  Measurement: Therapist cleansed and moisturized B LE.   Manual to include short neck, axillary nodes, deep and superficial abdominal axillary/inguinal anastomosis followed by B LE  Compression bandaging from toes to knee B using multilayer short stretch bandaging and 1/2 foam.      PATIENT EDUCATION: 04/10/24:  The benefits of completing self manual specifically working on moving fluid from inner to outer thigh . Education details: four aspects to treating lymphedema to include 1)Skin care, 2)exercise, 3)manual, 4) compression .   Person educated: Patient Education method: Explanation, Verbal cues, and Handouts Education comprehension: verbalized understanding  HOME EXERCISE PROGRAM: Ankle pumps, Long arc quad, hip ab/adduction, marching in place, diaphragm breathing.    ASSESSMENT:  CLINICAL IMPRESSION:   Pt skin appears healthier, measurements taken with decreased volume including thigh area.   Manual decompression complete for inguinal to axillary anastomosis anterior only with increased focus on medial thigh due to induration present.  Pt will continue to benefit from skilled PT for total decongestive  techniques.   OBJECTIVE IMPAIRMENTS: decreased activity tolerance, decreased mobility, difficulty walking, increased edema, and obesity.   ACTIVITY LIMITATIONS: bathing, toileting, dressing, hygiene/grooming, and locomotion level  PARTICIPATION LIMITATIONS: cleaning, shopping, community activity, and occupation  PERSONAL FACTORS: Fitness, Time since onset of injury/illness/exacerbation, and 1 comorbidity: obesity are also affecting patient's functional outcome.   REHAB POTENTIAL: Good  CLINICAL DECISION MAKING: Evolving/moderate complexity  EVALUATION COMPLEXITY: Moderate  GOALS: Goals reviewed with patient? No  SHORT TERM GOALS: Target date: 05/06/24  PT  to be I in skin care to decrease the risk of cellulitis Baseline: Goal status: on-going   2.  Pt to be I in HEP in order to improve lymphatic circulation Baseline:  Goal status: on-going  3.  Pt skin integrity to have improved noted by skin no longer flaking off Baseline:  Goal status: on-going  4.  PT to have lost 4 cm from LE measurement to decrease risk of cellulitis  Baseline:  Goal status:on-going   LONG TERM GOALS: Target date: 06/03/24  Pt to be I in donning a trim to fit compression garment Baseline:  Goal status: on-going  2.  PT to be using her pump Baseline:  Goal status:on-going  3.  PT to have lost 10 cm or more from LE measurement to allow improved mobilityu Baseline:  Goal status: on-going  4.  Pt to be able to complete 8 sit to stand in 30 minutes.   Baseline:  Goal status: on-going   PLAN:  PT FREQUENCY: 3x/week  PT DURATION: 6 weeks  PLANNED INTERVENTIONS: 97110-Therapeutic exercises, 97535- Self Care, and 02859- Manual therapy  PLAN FOR NEXT SESSION:   Begin thighs when supplies come in.  Montie Metro, PT CLT [phone]   04/15/2024, 11:51 AM

## 2024-04-17 ENCOUNTER — Encounter (HOSPITAL_COMMUNITY): Payer: Self-pay | Admitting: Physical Therapy

## 2024-04-20 ENCOUNTER — Encounter (HOSPITAL_COMMUNITY): Payer: Self-pay

## 2024-04-20 ENCOUNTER — Ambulatory Visit (HOSPITAL_COMMUNITY): Payer: Self-pay

## 2024-04-20 DIAGNOSIS — I890 Lymphedema, not elsewhere classified: Secondary | ICD-10-CM | POA: Diagnosis not present

## 2024-04-20 NOTE — Therapy (Signed)
 OUTPATIENT PHYSICAL THERAPY LYMPHEDEMA Treatment   Patient Name: Victoria Cummings MRN: [id number] DOB:1966/07/30, 58 y.o., female Today's Date: 04/20/2024  END OF SESSION:  PT End of Session - 04/20/24 1201     Visit Number 6    Number of Visits 24    Date for PT Re-Evaluation 06/03/24    Authorization Type medicare    Progress Note Due on Visit 10    PT Start Time 1005    PT Stop Time 1140    PT Time Calculation (min) 95 min    Activity Tolerance Patient tolerated treatment well    Behavior During Therapy WFL for tasks assessed/performed           Past Medical History:  Diagnosis Date   Anemia    Bronchitis    DVT (deep venous thrombosis) (HCC)    Hyperlipidemia    Hypertension    Lymph edema    Morbid obesity (HCC)    Pneumonia    History reviewed. No pertinent surgical history. Patient Active Problem List   Diagnosis Date Noted   Iron deficiency anemia due to chronic blood loss 04/22/2019   History of heavy vaginal bleeding 04/14/2019   PMB (postmenopausal bleeding) 04/14/2019   Encounter for gynecological examination with Papanicolaou smear of cervix 04/14/2019   Acute pulmonary embolism (HCC)    Anemia    DVT (deep venous thrombosis) (HCC) 10/08/2018    PCP: [facility]  REFERRING PROVIDER: Bucio, Melodi Sprung, FNP  REFERRING DIAG: lymphedema  THERAPY DIAG:  lymphedema  Rationale for Evaluation and Treatment: Rehabilitation  ONSET DATE: Since she was 14 primary lymphedema  SUBJECTIVE:                                                                                                                                                                                           SUBJECTIVE STATEMENT:  Pt canceled last session due to blisters on Lt foot toes, increased difficulty to wear shoes.  Exercises and self manual going well.    PERTINENT HISTORY:  morbid obesity, HTN, anemia, remote hx of DVT  PAIN:  Are you having pain? Yes not from the  lymphedema from OA we will not be addressing.  NPRS scale: 5/10 Pain location: B knees PRECAUTIONS: Other: cellulitis    WEIGHT BEARING RESTRICTIONS: No  FALLS:  Has patient fallen in last 6 months? No  LIVING ENVIRONMENT: Lives with: lives alone Lives in: House/apartment Has following equipment at home: Single point cane  OCCUPATION: not employed   PATIENT GOALS:  I want my legs to get smaller and to walk better.     OBJECTIVE: Note: Objective  measures were completed at Evaluation unless otherwise noted.  COGNITION: Overall cognitive status: Within functional limits for tasks assessed   PALPATION: Noted induration   OBSERVATIONS / OTHER ASSESSMENTS: noted    LYMPHEDEMA ASSESSMENTS:     LE LANDMARK RIGHT-sitting eval 04/15/24  At groin    30 cm proximal to suprapatella    20 cm proximal to suprapatella 106.5 105  10 cm proximal to suprapatella 104.5 104.3  At midpatella / popliteal crease 61.5 under lobe 59  30 cm proximal to floor at lateral plantar foot 87 84.5  20 cm proximal to floor at lateral plantar foot 75 75  10 cm proximal to floor at lateral plantar foot 64.9 63.2  Circumference of ankle/heel 41.5 42  5 cm proximal to 1st MTP joint 33 31.5  Across MTP joint 28.8 31  Around proximal great toe    (Blank rows = not tested)  LE LANDMARK LEFT eval 04/15/24  At groin    30 cm proximal to suprapatella    20 cm proximal to suprapatella 147 140  10 cm proximal to suprapatella 129.8 129.5  At midpatella / popliteal crease 79 79  30 cm proximal to floor at lateral plantar foot 43.5 58  20 cm proximal to floor at lateral plantar foot 78.5 71  10 cm proximal to floor at lateral plantar foot 75.8 62  Circumference of ankle/heel 42 41.2  5 cm proximal to 1st MTP joint 32.8 32.2  Across MTP joint 27.8 28.3  Around proximal great toe    (Blank rows = not tested)  FUNCTIONAL TESTS:   30 second sit to stand: 4 1:30 minute walk test: 76 feet.  Pt uses a cane  and had to rest once.      TODAY'S TREATMENT:                                                                                                                                     DATE: 04/20/24:   Cleansed and moisturized BLE Manual to include short neck, axillary nodes, deep and superficial abdominal axillary/inguinal anastomosis followed by B LE  Compression bandaging from toes to knee B using multilayer short stretch bandaging and 1/2 foam.  Additional toe wrap Lt foot.  04/15/24:  Measurement: Therapist cleansed and moisturized B LE.   Manual to include short neck, axillary nodes, deep and superficial abdominal axillary/inguinal anastomosis followed by B LE  Compression bandaging from toes to knee B using multilayer short stretch bandaging and 1/2 foam.      PATIENT EDUCATION: 04/10/24:  The benefits of completing self manual specifically working on moving fluid from inner to outer thigh . Education details: four aspects to treating lymphedema to include 1)Skin care, 2)exercise, 3)manual, 4) compression .   Person educated: Patient Education method: Explanation, Verbal cues, and Handouts Education comprehension: verbalized understanding  HOME EXERCISE PROGRAM: Ankle pumps, Long arc quad, hip ab/adduction, marching in place,  diaphragm breathing.    ASSESSMENT:  CLINICAL IMPRESSION:    Cleansed and moisturized LE well, improved skin integrity though does continue to display significant amount of dead/dry skin.  Strength is improving with increased assistance during wrapping time.  Manual decongestive techniques complete for inguinal to axillary anastomosis anterior only with increased focus on medial tight to address induration.  Noted 6 blisters on Lt toes, added toe wrap to Lt foot.  OBJECTIVE IMPAIRMENTS: decreased activity tolerance, decreased mobility, difficulty walking, increased edema, and obesity.   ACTIVITY LIMITATIONS: bathing, toileting, dressing, hygiene/grooming,  and locomotion level  PARTICIPATION LIMITATIONS: cleaning, shopping, community activity, and occupation  PERSONAL FACTORS: Fitness, Time since onset of injury/illness/exacerbation, and 1 comorbidity: obesity are also affecting patient's functional outcome.   REHAB POTENTIAL: Good  CLINICAL DECISION MAKING: Evolving/moderate complexity  EVALUATION COMPLEXITY: Moderate  GOALS: Goals reviewed with patient? No  SHORT TERM GOALS: Target date: 05/06/24  PT to be I in skin care to decrease the risk of cellulitis Baseline: Goal status: on-going   2.  Pt to be I in HEP in order to improve lymphatic circulation Baseline:  Goal status: on-going  3.  Pt skin integrity to have improved noted by skin no longer flaking off Baseline:  Goal status: on-going  4.  PT to have lost 4 cm from LE measurement to decrease risk of cellulitis  Baseline:  Goal status:on-going   LONG TERM GOALS: Target date: 06/03/24  Pt to be I in donning a trim to fit compression garment Baseline:  Goal status: on-going  2.  PT to be using her pump Baseline:  Goal status:on-going  3.  PT to have lost 10 cm or more from LE measurement to allow improved mobilityu Baseline:  Goal status: on-going  4.  Pt to be able to complete 8 sit to stand in 30 minutes.   Baseline:  Goal status: on-going   PLAN:  PT FREQUENCY: 3x/week  PT DURATION: 6 weeks  PLANNED INTERVENTIONS: 97110-Therapeutic exercises, 97535- Self Care, and 16109- Manual therapy  PLAN FOR NEXT SESSION:   Begin thighs when supplies come in.  Minor Amble, LPTA/CLT; Johnye Napoleon [phone]  04/20/2024, 12:03 PM

## 2024-04-22 ENCOUNTER — Encounter (HOSPITAL_COMMUNITY): Payer: Self-pay | Admitting: Physical Therapy

## 2024-04-24 ENCOUNTER — Ambulatory Visit (HOSPITAL_COMMUNITY): Payer: Self-pay | Admitting: Physical Therapy

## 2024-04-24 DIAGNOSIS — I890 Lymphedema, not elsewhere classified: Secondary | ICD-10-CM | POA: Diagnosis not present

## 2024-04-24 NOTE — Therapy (Addendum)
 OUTPATIENT PHYSICAL THERAPY LYMPHEDEMA Treatment   Patient Name: Victoria Cummings MRN: [id number] DOB:[birth year], 58 y.o., female Today's Date: 04/24/2024  END OF SESSION:  PT End of Session - 04/24/24 1221     Visit Number 7    Number of Visits 24    Date for PT Re-Evaluation 06/03/24    Authorization Type medicare    Progress Note Due on Visit 10    PT Start Time 1015    PT Stop Time 1150    PT Time Calculation (min) 95 min    Activity Tolerance Patient tolerated treatment well    Behavior During Therapy WFL for tasks assessed/performed            Past Medical History:  Diagnosis Date   Anemia    Bronchitis    DVT (deep venous thrombosis) (HCC)    Hyperlipidemia    Hypertension    Lymph edema    Morbid obesity (HCC)    Pneumonia    No past surgical history on file. Patient Active Problem List   Diagnosis Date Noted   Iron deficiency anemia due to chronic blood loss 04/22/2019   History of heavy vaginal bleeding 04/14/2019   PMB (postmenopausal bleeding) 04/14/2019   Encounter for gynecological examination with Papanicolaou smear of cervix 04/14/2019   Acute pulmonary embolism (HCC)    Anemia    DVT (deep venous thrombosis) (HCC) 10/08/2018    PCP: [facility]  REFERRING PROVIDER: Bucio, Elsa C, FNP  REFERRING DIAG: lymphedema  THERAPY DIAG:  lymphedema  Rationale for Evaluation and Treatment: Rehabilitation  ONSET DATE: Since she was 14 primary lymphedema  SUBJECTIVE:                                                                                                                                                                                           SUBJECTIVE STATEMENT: PT did not come to last session due to her car breaking down.  She has continued to complete her self manual as well as her exercises but has not been bandaged since 6/17/245.  PERTINENT HISTORY:  morbid obesity, HTN, anemia, remote hx of DVT  PAIN:  Are you  having pain? Yes not from the lymphedema from OA we will not be addressing.  NPRS scale: 5/10 Pain location: B knees PRECAUTIONS: Other: cellulitis    WEIGHT BEARING RESTRICTIONS: No  FALLS:  Has patient fallen in last 6 months? No  LIVING ENVIRONMENT: Lives with: lives alone Lives in: House/apartment Has following equipment at home: Single point cane  OCCUPATION: not employed   PATIENT GOALS:  I want my legs to get smaller and to  walk better.     OBJECTIVE: Note: Objective measures were completed at Evaluation unless otherwise noted.  COGNITION: Overall cognitive status: Within functional limits for tasks assessed   PALPATION: Noted induration   OBSERVATIONS / OTHER ASSESSMENTS: noted    LYMPHEDEMA ASSESSMENTS:     LE LANDMARK RIGHT-sitting eval 04/15/24 04/23/24  At groin     30 cm proximal to suprapatella     20 cm proximal to suprapatella 106.5 105 106.5   10 cm proximal to suprapatella 104.5 104.3 106.2  At midpatella / popliteal crease 61.5 under lobe 59 60  30 cm proximal to floor at lateral plantar foot 87 84.5 69  20 cm proximal to floor at lateral plantar foot 75 75 84  10 cm proximal to floor at lateral plantar foot 64.9 63.2 35.8  Circumference of ankle/heel 41.5 42 40  5 cm proximal to 1st MTP joint 33 31.5 32  Across MTP joint 28.8 31 29   Around proximal great toe     (Blank rows = not tested)  LE LANDMARK LEFT eval 04/15/24 04/23/24  At groin     30 cm proximal to suprapatella     20 cm proximal to suprapatella 147 140 144  10 cm proximal to suprapatella 129.8 129.5 131  At midpatella / popliteal crease 79 79 75  30 cm proximal to floor at lateral plantar foot 73.5 58 68  20 cm proximal to floor at lateral plantar foot 78.5 71 74  10 cm proximal to floor at lateral plantar foot 75.8 62 64  Circumference of ankle/heel 42 41.2 42  5 cm proximal to 1st MTP joint 32.8 32.2 32.2  Across MTP joint 27.8 28.3 28.7  Around proximal great toe      (Blank rows = not tested)  FUNCTIONAL TESTS:   30 second sit to stand: 4 1:30 minute walk test: 76 feet.  Pt uses a cane and had to rest once.      TODAY'S TREATMENT:                                                                                                                                     DATE: 04/24/24:  measurement Cleansed and moisturized BLE Manual to include short neck, axillary nodes, deep and superficial abdominal axillary/inguinal anastomosis followed by B LE  Compression bandaging from toes to knee B using multilayer short stretch bandaging and 1/2 foam.  Additional toe wrap Lt foot.   PATIENT EDUCATION: 04/10/24:  The benefits of completing self manual specifically working on moving fluid from inner to outer thigh . Education details: four aspects to treating lymphedema to include 1)Skin care, 2)exercise, 3)manual, 4) compression .   Person educated: Patient Education method: Explanation, Verbal cues, and Handouts Education comprehension: verbalized understanding  HOME EXERCISE PROGRAM: Ankle pumps, Long arc quad, hip ab/adduction, marching in place, diaphragm breathing.    ASSESSMENT:  CLINICAL IMPRESSION:   Measurements  are up most likely due to not being bandaged.   Cleansed and moisturized LE well, improved skin integrity though  pt does continue to display significant amount of dead/dry skin.   Manual decongestive techniques complete to include: Short neck, superficial and deep abdominal,  inguinal to axillary anastomosis and B LE. Blisters on Lt toes are still present.  PT will continue to benefit from skilled PT for decongestive techniques.  Thigh area unable to be bandaged as bandages have not arrived yet.    OBJECTIVE IMPAIRMENTS: decreased activity tolerance, decreased mobility, difficulty walking, increased edema, and obesity.   ACTIVITY LIMITATIONS: bathing, toileting, dressing, hygiene/grooming, and locomotion level  PARTICIPATION LIMITATIONS:  cleaning, shopping, community activity, and occupation  PERSONAL FACTORS: Fitness, Time since onset of injury/illness/exacerbation, and 1 comorbidity: obesity are also affecting patient's functional outcome.   REHAB POTENTIAL: Good  CLINICAL DECISION MAKING: Evolving/moderate complexity  EVALUATION COMPLEXITY: Moderate  GOALS: Goals reviewed with patient? No  SHORT TERM GOALS: Target date: 05/06/24  PT to be I in skin care to decrease the risk of cellulitis Baseline: Goal status: on-going   2.  Pt to be I in HEP in order to improve lymphatic circulation Baseline:  Goal status: met  3.  Pt skin integrity to have improved noted by skin no longer flaking off Baseline:  Goal status: on-going  4.  PT to have lost 4 cm from LE measurement to decrease risk of cellulitis  Baseline:  Goal status:on-going   LONG TERM GOALS: Target date: 06/03/24  Pt to be I in donning a trim to fit compression garment Baseline:  Goal status: on-going  2.  PT to be using her pump Baseline:  Goal status:on-going  3.  PT to have lost 10 cm or more from LE measurement to allow improved mobilityu Baseline:  Goal status: on-going  4.  Pt to be able to complete 8 sit to stand in 30 minutes.   Baseline:  Goal status: on-going   PLAN:  PT FREQUENCY: 3x/week  PT DURATION: 6 weeks  PLANNED INTERVENTIONS: 97110-Therapeutic exercises, 97535- Self Care, and 02859- Manual therapy  PLAN FOR NEXT SESSION:   Begin thighs when supplies come in.  Montie Metro, PT CLT [phone]    04/24/2024, 12:21 PM

## 2024-04-27 ENCOUNTER — Encounter (HOSPITAL_COMMUNITY): Payer: Self-pay

## 2024-04-27 ENCOUNTER — Ambulatory Visit (HOSPITAL_COMMUNITY): Payer: Self-pay

## 2024-04-27 DIAGNOSIS — I890 Lymphedema, not elsewhere classified: Secondary | ICD-10-CM | POA: Diagnosis not present

## 2024-04-27 NOTE — Therapy (Signed)
 OUTPATIENT PHYSICAL THERAPY LYMPHEDEMA Treatment   Patient Name: Victoria Cummings MRN: [id number] DOB:[birth year], 58 y.o., female Today's Date: 04/27/2024  END OF SESSION:  PT End of Session - 04/27/24 0957     Visit Number 8    Number of Visits 24    Date for PT Re-Evaluation 06/03/24    Authorization Type medicare    Progress Note Due on Visit 10    PT Start Time 1000    PT Stop Time 1140    PT Time Calculation (min) 100 min    Activity Tolerance Patient tolerated treatment well    Behavior During Therapy WFL for tasks assessed/performed            Past Medical History:  Diagnosis Date   Anemia    Bronchitis    DVT (deep venous thrombosis) (HCC)    Hyperlipidemia    Hypertension    Lymph edema    Morbid obesity (HCC)    Pneumonia    History reviewed. No pertinent surgical history. Patient Active Problem List   Diagnosis Date Noted   Iron deficiency anemia due to chronic blood loss 04/22/2019   History of heavy vaginal bleeding 04/14/2019   PMB (postmenopausal bleeding) 04/14/2019   Encounter for gynecological examination with Papanicolaou smear of cervix 04/14/2019   Acute pulmonary embolism (HCC)    Anemia    DVT (deep venous thrombosis) (HCC) 10/08/2018    PCP: [facility]  REFERRING PROVIDER: Bucio, Silvio BROCKS, FNP  REFERRING DIAG: lymphedema  THERAPY DIAG:  lymphedema  Rationale for Evaluation and Treatment: Rehabilitation  ONSET DATE: Since she was 14 primary lymphedema  SUBJECTIVE:                                                                                                                                                                                           SUBJECTIVE STATEMENT:  Pt stated wraps fell off her Rt leg on Saturday, toe wraps fall off quickly.  Exercises and self manual are going well, feels she is getting stronger.    PERTINENT HISTORY:  morbid obesity, HTN, anemia, remote hx of DVT  PAIN:  Are you having  pain? Yes not from the lymphedema from OA we will not be addressing.  NPRS scale: 5/10 Pain location: B knees PRECAUTIONS: Other: cellulitis    WEIGHT BEARING RESTRICTIONS: No  FALLS:  Has patient fallen in last 6 months? No  LIVING ENVIRONMENT: Lives with: lives alone Lives in: House/apartment Has following equipment at home: Single point cane  OCCUPATION: not employed   PATIENT GOALS:  I want my legs to get smaller and to walk better.  OBJECTIVE: Note: Objective measures were completed at Evaluation unless otherwise noted.  COGNITION: Overall cognitive status: Within functional limits for tasks assessed   PALPATION: Noted induration   OBSERVATIONS / OTHER ASSESSMENTS: noted    LYMPHEDEMA ASSESSMENTS:     LE LANDMARK RIGHT-sitting eval 04/15/24 04/23/24  At groin     30 cm proximal to suprapatella     20 cm proximal to suprapatella 106.5 105 106.5   10 cm proximal to suprapatella 104.5 104.3 106.2  At midpatella / popliteal crease 61.5 under lobe 59 60  30 cm proximal to floor at lateral plantar foot 87 84.5 69  20 cm proximal to floor at lateral plantar foot 75 75 84  10 cm proximal to floor at lateral plantar foot 64.9 63.2 35.8  Circumference of ankle/heel 41.5 42 40  5 cm proximal to 1st MTP joint 33 31.5 32  Across MTP joint 28.8 31 29   Around proximal great toe     (Blank rows = not tested)  LE LANDMARK LEFT eval 04/15/24 04/23/24  At groin     30 cm proximal to suprapatella     20 cm proximal to suprapatella 147 140 144  10 cm proximal to suprapatella 129.8 129.5 131  At midpatella / popliteal crease 79 79 75  30 cm proximal to floor at lateral plantar foot 43.5 58 68  20 cm proximal to floor at lateral plantar foot 78.5 71 74  10 cm proximal to floor at lateral plantar foot 75.8 62 64  Circumference of ankle/heel 42 41.2 42  5 cm proximal to 1st MTP joint 32.8 32.2 32.2  Across MTP joint 27.8 28.3 28.7  Around proximal great toe     (Blank  rows = not tested)  FUNCTIONAL TESTS:   30 second sit to stand: 4 1:30 minute walk test: 76 feet.  Pt uses a cane and had to rest once.      TODAY'S TREATMENT:     DATE: 04/27/24: Cleansed and moisturized BLE Manual to include short neck, axillary nodes, deep and superficial abdominal axillary/inguinal anastomosis followed by B LE  Compression bandaging from toes to knee B using multilayer short stretch bandaging and 1/2 foam.  Additional toe wrap Lt foot.   Media Information     Media Information   Document Information  Photos  Lt leg  04/27/2024 10:52  Attached To:  Outpatient Rehab on 04/27/24 with Renae Augustin PARAS, PTA  Source Information  Renae Augustin PARAS, PTA  [facility]   Document Information  Photos  Rt leg  04/27/2024 10:52  Attached To:  Outpatient Rehab on 04/27/24 with Renae Augustin PARAS, PTA  Source Information  Renae Augustin PARAS, PTA  [facility]                                                                                                                                      04/24/24:  measurement Cleansed and moisturized BLE Manual to include short neck, axillary nodes, deep and superficial abdominal axillary/inguinal anastomosis followed by B LE  Compression bandaging from toes to knee B using multilayer short stretch bandaging and 1/2 foam.  Additional toe wrap Lt foot.   PATIENT EDUCATION: 04/10/24:  The benefits of completing self manual specifically working on moving fluid from inner to outer thigh . Education details: four aspects to treating lymphedema to include 1)Skin care, 2)exercise, 3)manual, 4) compression .   Person educated: Patient Education method: Explanation, Verbal cues, and Handouts Education comprehension: verbalized understanding  HOME EXERCISE PROGRAM: Ankle pumps, Long arc quad, hip ab/adduction, marching in place, diaphragm breathing.    ASSESSMENT:  CLINICAL IMPRESSION:    Manual  decongestive techniques with focus on inguinal to axillary anastomosis and BLE, main focus on Lt medial light as most induration present.  Cleansed and moistured LE well, improve skin integrity though does continue to have significant amount of dead/dry skin especially medial thighs.  (Picture in media).  4 blisters remain on Lt foot.  Continued with toe wrap Lt foot, and multilayer short stretch bandages with 1/2in foam to knees.  Thigh bandages have note arrived yet.   OBJECTIVE IMPAIRMENTS: decreased activity tolerance, decreased mobility, difficulty walking, increased edema, and obesity.   ACTIVITY LIMITATIONS: bathing, toileting, dressing, hygiene/grooming, and locomotion level  PARTICIPATION LIMITATIONS: cleaning, shopping, community activity, and occupation  PERSONAL FACTORS: Fitness, Time since onset of injury/illness/exacerbation, and 1 comorbidity: obesity are also affecting patient's functional outcome.   REHAB POTENTIAL: Good  CLINICAL DECISION MAKING: Evolving/moderate complexity  EVALUATION COMPLEXITY: Moderate  GOALS: Goals reviewed with patient? No  SHORT TERM GOALS: Target date: 05/06/24  PT to be I in skin care to decrease the risk of cellulitis Baseline: Goal status: on-going   2.  Pt to be I in HEP in order to improve lymphatic circulation Baseline:  Goal status: met  3.  Pt skin integrity to have improved noted by skin no longer flaking off Baseline:  Goal status: on-going  4.  PT to have lost 4 cm from LE measurement to decrease risk of cellulitis  Baseline:  Goal status:on-going   LONG TERM GOALS: Target date: 06/03/24  Pt to be I in donning a trim to fit compression garment Baseline:  Goal status: on-going  2.  PT to be using her pump Baseline:  Goal status:on-going  3.  PT to have lost 10 cm or more from LE measurement to allow improved mobilityu Baseline:  Goal status: on-going  4.  Pt to be able to complete 8 sit to stand in 30 minutes.    Baseline:  Goal status: on-going   PLAN:  PT FREQUENCY: 3x/week  PT DURATION: 6 weeks  PLANNED INTERVENTIONS: 97110-Therapeutic exercises, 97535- Self Care, and 02859- Manual therapy  PLAN FOR NEXT SESSION:   Begin thighs when supplies come in.  Augustin Mclean, LPTA/CLT; [facility] [phone]    04/27/2024, 11:45 AM

## 2024-04-29 ENCOUNTER — Ambulatory Visit (HOSPITAL_COMMUNITY): Payer: Self-pay | Admitting: Physical Therapy

## 2024-04-29 DIAGNOSIS — I890 Lymphedema, not elsewhere classified: Secondary | ICD-10-CM | POA: Diagnosis not present

## 2024-04-29 NOTE — Therapy (Addendum)
 OUTPATIENT PHYSICAL THERAPY LYMPHEDEMA Treatment   Patient Name: Victoria Cummings MRN: [id number] DOB:August 23, 1966, 58 y.o., female Today's Date: 04/29/2024  END OF SESSION:  PT End of Session - 04/29/24 1151     Visit Number 9    Number of Visits 24    Date for PT Re-Evaluation 06/03/24    Authorization Type medicare    Progress Note Due on Visit 10    PT Start Time 1010    PT Stop Time 1148    PT Time Calculation (min) 98 min    Activity Tolerance Patient tolerated treatment well    Behavior During Therapy WFL for tasks assessed/performed             Past Medical History:  Diagnosis Date   Anemia    Bronchitis    DVT (deep venous thrombosis) (HCC)    Hyperlipidemia    Hypertension    Lymph edema    Morbid obesity (HCC)    Pneumonia    No past surgical history on file. Patient Active Problem List   Diagnosis Date Noted   Iron deficiency anemia due to chronic blood loss 04/22/2019   History of heavy vaginal bleeding 04/14/2019   PMB (postmenopausal bleeding) 04/14/2019   Encounter for gynecological examination with Papanicolaou smear of cervix 04/14/2019   Acute pulmonary embolism (HCC)    Anemia    DVT (deep venous thrombosis) (HCC) 10/08/2018    PCP: [facility]  REFERRING PROVIDER: Bucio, Elsa C, FNP  REFERRING DIAG: lymphedema  THERAPY DIAG:  lymphedema  Rationale for Evaluation and Treatment: Rehabilitation  ONSET DATE: Since she was 14 primary lymphedema  SUBJECTIVE:                                                                                                                                                                                           SUBJECTIVE STATEMENT:  Pt has no complaints doing well.  Completing exercises and trying to walk more.   PERTINENT HISTORY:  morbid obesity, HTN, anemia, remote hx of DVT  PAIN:  Are you having pain? Yes not from the lymphedema from OA we will not be addressing.  NPRS scale:  5/10 Pain location: B knees PRECAUTIONS: Other: cellulitis    WEIGHT BEARING RESTRICTIONS: No  FALLS:  Has patient fallen in last 6 months? No  LIVING ENVIRONMENT: Lives with: lives alone Lives in: House/apartment Has following equipment at home: Single point cane  OCCUPATION: not employed   PATIENT GOALS:  I want my legs to get smaller and to walk better.     OBJECTIVE: Note: Objective measures were completed at Evaluation unless otherwise noted.  COGNITION: Overall cognitive status: Within functional limits for tasks assessed   PALPATION: Noted induration   OBSERVATIONS / OTHER ASSESSMENTS: noted    LYMPHEDEMA ASSESSMENTS:     LE LANDMARK RIGHT-sitting eval 04/15/24 04/23/24 04/29/24  At groin      30 cm proximal to suprapatella      20 cm proximal to suprapatella 106.5 105 106.5  103.3  10 cm proximal to suprapatella 104.5 104.3 106.2 102.4  At midpatella / popliteal crease 61.5 under lobe 59 60 59  30 cm proximal to floor at lateral plantar foot 87 84.5 69 71  20 cm proximal to floor at lateral plantar foot 75 75 84 79.8  10 cm proximal to floor at lateral plantar foot 64.9 63.2 65.8 65  Circumference of ankle/heel 41.5 42 40 39.5  5 cm proximal to 1st MTP joint 33 31.5 32 31.9  Across MTP joint 28.8 31 29  28.5  Around proximal great toe      (Blank rows = not tested)  LE LANDMARK LEFT eval 04/15/24 04/23/24 04/29/24  At groin      30 cm proximal to suprapatella      20 cm proximal to suprapatella 147 140 144 143  10 cm proximal to suprapatella 129.8 129.5 131 129.8  At midpatella / popliteal crease 79 79 75 68  30 cm proximal to floor at lateral plantar foot 73.5 58 68 69  20 cm proximal to floor at lateral plantar foot 78.5 71 74 70  10 cm proximal to floor at lateral plantar foot 75.8 62 64 64  Circumference of ankle/heel 42 41.2 42 41  5 cm proximal to 1st MTP joint 32.8 32.2 32.2 31.6  Across MTP joint 27.8 28.3 28.7 28.8  Around proximal great toe       (Blank rows = not tested)  FUNCTIONAL TESTS:   30 second sit to stand: 4 1:30 minute walk test: 76 feet.  Pt uses a cane and had to rest once.      TODAY'S TREATMENT:     DATE: 04/29/24: measurement  Cleansed and moisturized BLE Manual to include short neck, axillary nodes, deep and superficial abdominal axillary/inguinal anastomosis followed by B LE  Compression bandaging from toes to knee B using multilayer short stretch bandaging and 1/2 foam.  Additional toe wrap Lt foot. Added new exercises as below to HEP>   Media Information 04/27/24     Media Information   Document Information Evaluation                                                                                                                                   .   PATIENT EDUCATION:  04/29/24:  Cervical ROM, cervical retraction, shoulder rolls, trunk Side bend.  04/10/24:  The benefits of completing self manual specifically working on moving fluid from inner to outer thigh . Education details: four aspects to treating lymphedema to include 1)Skin care,  2)exercise, 3)manual, 4) compression .   Person educated: Patient Education method: Explanation, Verbal cues, and Handouts Education comprehension: verbalized understanding  HOME EXERCISE PROGRAM: 04/29/24: Cervical ROM, cervical retraction, shoulder rolls, trunk Side bend.  04/10/24:   Ankle pumps, Long arc quad, hip ab/adduction, marching in place, diaphragm breathing.    ASSESSMENT:  CLINICAL IMPRESSION:   PT measured with noted decrease in edema.  Pt skin health has improved significantly.  Manual decongestive techniques with focus Lt LE. Therapist reviewed new exercises with pt adding cervical and trunk ROM.   OBJECTIVE IMPAIRMENTS: decreased activity tolerance, decreased mobility, difficulty walking, increased edema, and obesity.   ACTIVITY LIMITATIONS: bathing, toileting, dressing, hygiene/grooming, and locomotion level  PARTICIPATION  LIMITATIONS: cleaning, shopping, community activity, and occupation  PERSONAL FACTORS: Fitness, Time since onset of injury/illness/exacerbation, and 1 comorbidity: obesity are also affecting patient's functional outcome.   REHAB POTENTIAL: Good  CLINICAL DECISION MAKING: Evolving/moderate complexity  EVALUATION COMPLEXITY: Moderate  GOALS: Goals reviewed with patient? No  SHORT TERM GOALS: Target date: 05/06/24  PT to be I in skin care to decrease the risk of cellulitis Baseline: Goal status: on-going   2.  Pt to be I in HEP in order to improve lymphatic circulation Baseline:  Goal status: met  3.  Pt skin integrity to have improved noted by skin no longer flaking off Baseline:  Goal status: on-going  4.  PT to have lost 4 cm from LE measurement to decrease risk of cellulitis  Baseline:  Goal status:on-going   LONG TERM GOALS: Target date: 06/03/24  Pt to be I in donning a trim to fit compression garment Baseline:  Goal status: on-going  2.  PT to be using her pump Baseline:  Goal status:on-going  3.  PT to have lost 10 cm or more from LE measurement to allow improved mobilityu Baseline:  Goal status: on-going  4.  Pt to be able to complete 8 sit to stand in 30 minutes.   Baseline:  Goal status: on-going   PLAN:  PT FREQUENCY: 3x/week  PT DURATION: 6 weeks  PLANNED INTERVENTIONS: 97110-Therapeutic exercises, 97535- Self Care, and 02859- Manual therapy  PLAN FOR NEXT SESSION:   Begin thighs when supplies come in.  Montie Metro, PT CLT [phone]     04/29/2024, 11:51 AM

## 2024-05-01 ENCOUNTER — Ambulatory Visit (HOSPITAL_COMMUNITY): Payer: Self-pay | Admitting: Physical Therapy

## 2024-05-01 DIAGNOSIS — I890 Lymphedema, not elsewhere classified: Secondary | ICD-10-CM | POA: Diagnosis not present

## 2024-05-01 NOTE — Therapy (Signed)
 OUTPATIENT PHYSICAL THERAPY LYMPHEDEMA Treatment /progress note  Patient Name: Victoria Cummings MRN: [id number] DOB:08-Dec-1965, 58 y.o., female Today's Date: 05/01/2024 Progress Note Reporting Period 04/08/24 to 05/01/24  See note below for Objective Data and Assessment of Progress/Goals.     END OF SESSION:  PT End of Session - 05/01/24 1429     Visit Number 10    Number of Visits 24    Date for PT Re-Evaluation 06/03/24    Authorization Type medicare    Progress Note Due on Visit 20    PT Start Time 1030    PT Stop Time 1150    PT Time Calculation (min) 80 min    Activity Tolerance Patient tolerated treatment well    Behavior During Therapy WFL for tasks assessed/performed             Past Medical History:  Diagnosis Date   Anemia    Bronchitis    DVT (deep venous thrombosis) (HCC)    Hyperlipidemia    Hypertension    Lymph edema    Morbid obesity (HCC)    Pneumonia    No past surgical history on file. Patient Active Problem List   Diagnosis Date Noted   Iron deficiency anemia due to chronic blood loss 04/22/2019   History of heavy vaginal bleeding 04/14/2019   PMB (postmenopausal bleeding) 04/14/2019   Encounter for gynecological examination with Papanicolaou smear of cervix 04/14/2019   Acute pulmonary embolism (HCC)    Anemia    DVT (deep venous thrombosis) (HCC) 10/08/2018    PCP: [facility]  REFERRING PROVIDER: Bucio, Elsa C, FNP  REFERRING DIAG: lymphedema  THERAPY DIAG:  lymphedema  Rationale for Evaluation and Treatment: Rehabilitation  ONSET DATE: Since she was 14 primary lymphedema  SUBJECTIVE:                                                                                                                                                                                           SUBJECTIVE STATEMENT:  Pt has no complaints she is completing her self manual and exercises.   PERTINENT HISTORY:  morbid obesity, HTN, anemia,  remote hx of DVT  PAIN:  Are you having pain? Yes not from the lymphedema from OA we will not be addressing.  NPRS scale: 5/10 Pain location: B knees PRECAUTIONS: Other: cellulitis    WEIGHT BEARING RESTRICTIONS: No  FALLS:  Has patient fallen in last 6 months? No  LIVING ENVIRONMENT: Lives with: lives alone Lives in: House/apartment Has following equipment at home: Single point cane  OCCUPATION: not employed   PATIENT GOALS:  I want my legs to get  smaller and to walk better.     OBJECTIVE: Note: Objective measures were completed at Evaluation unless otherwise noted.  COGNITION: Overall cognitive status: Within functional limits for tasks assessed   PALPATION: Noted induration   OBSERVATIONS / OTHER ASSESSMENTS: noted    LYMPHEDEMA ASSESSMENTS:     LE LANDMARK RIGHT-sitting Eval6/4/25 04/15/24 04/23/24 04/29/24  At groin      30 cm proximal to suprapatella      20 cm proximal to suprapatella 106.5 105 106.5  103.3  10 cm proximal to suprapatella 104.5 104.3 106.2 102.4  At midpatella / popliteal crease 61.5 under lobe 59 60 59  30 cm proximal to floor at lateral plantar foot 87 84.5 69 71  20 cm proximal to floor at lateral plantar foot 75 75 84 79.8  10 cm proximal to floor at lateral plantar foot 64.9 63.2 65.8 65  Circumference of ankle/heel 41.5 42 40 39.5  5 cm proximal to 1st MTP joint 33 31.5 32 31.9  Across MTP joint 28.8 31 29  28.5  Around proximal great toe      (Blank rows = not tested)  LE LANDMARK LEFT Eval6/4/25 04/15/24 04/23/24 04/29/24  At groin      30 cm proximal to suprapatella      20 cm proximal to suprapatella 147 140 144 143  10 cm proximal to suprapatella 129.8 129.5 131 129.8  At midpatella / popliteal crease 79 79 75 68  30 cm proximal to floor at lateral plantar foot 73.5 58 68 69  20 cm proximal to floor at lateral plantar foot 78.5 71 74 70  10 cm proximal to floor at lateral plantar foot 75.8 62 64 64  Circumference of  ankle/heel 42 41.2 42 41  5 cm proximal to 1st MTP joint 32.8 32.2 32.2 31.6  Across MTP joint 27.8 28.3 28.7 28.8  Around proximal great toe      (Blank rows = not tested)  FUNCTIONAL TESTS: 04/08/24  30 second sit to stand: 4 1:30 minute walk test: 76 feet.  Pt uses a cane and had to rest once.      TODAY'S TREATMENT:     DATE: 05/01/24:  Cleansed and moisturized BLE Manual to include short neck, axillary nodes, deep and superficial abdominal axillary/inguinal anastomosis followed by B LE  Compression bandaging from toes to knee B using multilayer short stretch bandaging and 1/2 foam.  Additional toe wrap Lt foot.  Media Information 04/27/24     Media Information   Document Information Evaluation 04/08/24                                                                                                                                  .   PATIENT EDUCATION:  04/29/24:  Cervical ROM, cervical retraction, shoulder rolls, trunk Side bend.  04/10/24:  The benefits of completing self manual specifically working on moving fluid from inner to outer thigh .  Education details: four aspects to treating lymphedema to include 1)Skin care, 2)exercise, 3)manual, 4) compression .   Person educated: Patient Education method: Explanation, Verbal cues, and Handouts Education comprehension: verbalized understanding  HOME EXERCISE PROGRAM: 04/29/24: Cervical ROM, cervical retraction, shoulder rolls, trunk Side bend.  04/10/24:   Ankle pumps, Long arc quad, hip ab/adduction, marching in place, diaphragm breathing.    ASSESSMENT:  CLINICAL IMPRESSION:   Pt has improved endurance with the ability of being able to walk back to the treatment room without having to rest.  Pt skin health has improved significantly and edema has decreased since initial evaluation.  PT will continue to benefit from total decongestive manual techniques with focus Lt LE.  OBJECTIVE IMPAIRMENTS: decreased activity  tolerance, decreased mobility, difficulty walking, increased edema, and obesity.   ACTIVITY LIMITATIONS: bathing, toileting, dressing, hygiene/grooming, and locomotion level  PARTICIPATION LIMITATIONS: cleaning, shopping, community activity, and occupation  PERSONAL FACTORS: Fitness, Time since onset of injury/illness/exacerbation, and 1 comorbidity: obesity are also affecting patient's functional outcome.   REHAB POTENTIAL: Good  CLINICAL DECISION MAKING: Evolving/moderate complexity  EVALUATION COMPLEXITY: Moderate  GOALS: Goals reviewed with patient? No  SHORT TERM GOALS: Target date: 05/06/24  PT to be I in skin care to decrease the risk of cellulitis Baseline: Goal status: on-going   2.  Pt to be I in HEP in order to improve lymphatic circulation Baseline:  Goal status: met  3.  Pt skin integrity to have improved noted by skin no longer flaking off Baseline:  Goal status: on-going  4.  PT to have lost 4 cm from LE measurement to decrease risk of cellulitis  Baseline:  Goal status:partially met    LONG TERM GOALS: Target date: 06/03/24  Pt to be I in donning a trim to fit compression garment Baseline:  Goal status: on-going  2.  PT to be using her pump Baseline:  Goal status:on-going  3.  PT to have lost 10 cm or more from LE measurement to allow improved mobilityu Baseline:  Goal status: on-going  4.  Pt to be able to complete 8 sit to stand in 30 minutes.   Baseline:  Goal status: on-going   PLAN:  PT FREQUENCY: 3x/week  PT DURATION: 6 weeks  PLANNED INTERVENTIONS: 97110-Therapeutic exercises, 97535- Self Care, and 02859- Manual therapy  PLAN FOR NEXT SESSION:   Begin thighs when supplies come in.  Montie Metro, PT CLT [phone]     05/01/2024, 2:30 PM

## 2024-05-04 ENCOUNTER — Ambulatory Visit (HOSPITAL_COMMUNITY): Payer: Self-pay | Admitting: Physical Therapy

## 2024-05-04 DIAGNOSIS — I890 Lymphedema, not elsewhere classified: Secondary | ICD-10-CM | POA: Diagnosis not present

## 2024-05-04 NOTE — Therapy (Signed)
 OUTPATIENT PHYSICAL THERAPY LYMPHEDEMA Treatment   Patient Name: Victoria Cummings MRN: [id number] DOB:01-16-1966, 58 y.o., female Today's Date: 05/04/2024    END OF SESSION:  PT End of Session - 05/04/24 1501     Visit Number 11    Number of Visits 24    Date for PT Re-Evaluation 06/03/24    Authorization Type medicare    Progress Note Due on Visit 20    PT Start Time 1322    PT Stop Time 1443    PT Time Calculation (min) 81 min    Activity Tolerance Patient tolerated treatment well    Behavior During Therapy WFL for tasks assessed/performed             Past Medical History:  Diagnosis Date   Anemia    Bronchitis    DVT (deep venous thrombosis) (HCC)    Hyperlipidemia    Hypertension    Lymph edema    Morbid obesity (HCC)    Pneumonia    No past surgical history on file. Patient Active Problem List   Diagnosis Date Noted   Iron deficiency anemia due to chronic blood loss 04/22/2019   History of heavy vaginal bleeding 04/14/2019   PMB (postmenopausal bleeding) 04/14/2019   Encounter for gynecological examination with Papanicolaou smear of cervix 04/14/2019   Acute pulmonary embolism (HCC)    Anemia    DVT (deep venous thrombosis) (HCC) 10/08/2018    PCP: [facility]  REFERRING PROVIDER: Bucio, Elsa C, FNP  REFERRING DIAG: lymphedema  THERAPY DIAG:  lymphedema  Rationale for Evaluation and Treatment: Rehabilitation  ONSET DATE: Since she was 14 primary lymphedema  SUBJECTIVE:                                                                                                                                                                                           SUBJECTIVE STATEMENT:  PT has no concerns.   PERTINENT HISTORY:  morbid obesity, HTN, anemia, remote hx of DVT  PAIN:  Are you having pain? Yes not from the lymphedema from OA we will not be addressing.  NPRS scale: 5/10 Pain location: B knees PRECAUTIONS: Other: cellulitis     WEIGHT BEARING RESTRICTIONS: No  FALLS:  Has patient fallen in last 6 months? No  LIVING ENVIRONMENT: Lives with: lives alone Lives in: House/apartment Has following equipment at home: Single point cane  OCCUPATION: not employed   PATIENT GOALS:  I want my legs to get smaller and to walk better.     OBJECTIVE: Note: Objective measures were completed at Evaluation unless otherwise noted.  COGNITION: Overall cognitive status: Within functional limits  for tasks assessed   PALPATION: Noted induration   OBSERVATIONS / OTHER ASSESSMENTS: noted    LYMPHEDEMA ASSESSMENTS:     LE LANDMARK RIGHT-sitting Eval6/4/25 04/15/24 04/23/24 04/29/24  At groin      30 cm proximal to suprapatella      20 cm proximal to suprapatella 106.5 105 106.5  103.3  10 cm proximal to suprapatella 104.5 104.3 106.2 102.4  At midpatella / popliteal crease 61.5 under lobe 59 60 59  30 cm proximal to floor at lateral plantar foot 87 84.5 69 71  20 cm proximal to floor at lateral plantar foot 75 75 84 79.8  10 cm proximal to floor at lateral plantar foot 64.9 63.2 65.8 65  Circumference of ankle/heel 41.5 42 40 39.5  5 cm proximal to 1st MTP joint 33 31.5 32 31.9  Across MTP joint 28.8 31 29  28.5  Around proximal great toe      (Blank rows = not tested)  LE LANDMARK LEFT Eval6/4/25 04/15/24 04/23/24 04/29/24  At groin      30 cm proximal to suprapatella      20 cm proximal to suprapatella 147 140 144 143  10 cm proximal to suprapatella 129.8 129.5 131 129.8  At midpatella / popliteal crease 79 79 75 68  30 cm proximal to floor at lateral plantar foot 73.5 58 68 69  20 cm proximal to floor at lateral plantar foot 78.5 71 74 70  10 cm proximal to floor at lateral plantar foot 75.8 62 64 64  Circumference of ankle/heel 42 41.2 42 41  5 cm proximal to 1st MTP joint 32.8 32.2 32.2 31.6  Across MTP joint 27.8 28.3 28.7 28.8  Around proximal great toe      (Blank rows = not tested)  FUNCTIONAL  TESTS: 04/08/24  30 second sit to stand: 4 1:30 minute walk test: 76 feet.  Pt uses a cane and had to rest once.      TODAY'S TREATMENT:     DATE: 05/04/24:  Cleansed and moisturized BLE Manual to include short neck, axillary nodes, deep and superficial abdominal axillary/inguinal anastomosis followed by B LE  Compression bandaging from toes to knee B using multilayer short stretch bandaging and 1/2 foam.  Additional toe wrap to Lt foot.  Media Information 04/27/24     Media Information   Document Information Evaluation 04/08/24                                                                                                                                  .   PATIENT EDUCATION:  04/29/24:  Cervical ROM, cervical retraction, shoulder rolls, trunk Side bend.  04/10/24:  The benefits of completing self manual specifically working on moving fluid from inner to outer thigh . Education details: four aspects to treating lymphedema to include 1)Skin care, 2)exercise, 3)manual, 4) compression .   Person educated: Patient Education method: Explanation, Verbal cues,  and Handouts Education comprehension: verbalized understanding  HOME EXERCISE PROGRAM: 04/29/24: Cervical ROM, cervical retraction, shoulder rolls, trunk Side bend.  04/10/24:   Ankle pumps, Long arc quad, hip ab/adduction, marching in place, diaphragm breathing.    ASSESSMENT:  CLINICAL IMPRESSION:   Pt skin health and edema continues to improve.  Manual notably softens indurated areas.   PT will continue to benefit from total decongestive manual techniques with focus Lt LE.  OBJECTIVE IMPAIRMENTS: decreased activity tolerance, decreased mobility, difficulty walking, increased edema, and obesity.   ACTIVITY LIMITATIONS: bathing, toileting, dressing, hygiene/grooming, and locomotion level  PARTICIPATION LIMITATIONS: cleaning, shopping, community activity, and occupation  PERSONAL FACTORS: Fitness, Time since onset of  injury/illness/exacerbation, and 1 comorbidity: obesity are also affecting patient's functional outcome.   REHAB POTENTIAL: Good  CLINICAL DECISION MAKING: Evolving/moderate complexity  EVALUATION COMPLEXITY: Moderate  GOALS: Goals reviewed with patient? No  SHORT TERM GOALS: Target date: 05/06/24  PT to be I in skin care to decrease the risk of cellulitis Baseline: Goal status: on-going   2.  Pt to be I in HEP in order to improve lymphatic circulation Baseline:  Goal status: met  3.  Pt skin integrity to have improved noted by skin no longer flaking off Baseline:  Goal status: on-going  4.  PT to have lost 4 cm from LE measurement to decrease risk of cellulitis  Baseline:  Goal status:partially met    LONG TERM GOALS: Target date: 06/03/24  Pt to be I in donning a trim to fit compression garment Baseline:  Goal status: on-going  2.  PT to be using her pump Baseline:  Goal status:on-going  3.  PT to have lost 10 cm or more from LE measurement to allow improved mobilityu Baseline:  Goal status: on-going  4.  Pt to be able to complete 8 sit to stand in 30 minutes.   Baseline:  Goal status: on-going   PLAN:  PT FREQUENCY: 3x/week  PT DURATION: 6 weeks  PLANNED INTERVENTIONS: 97110-Therapeutic exercises, 97535- Self Care, and 02859- Manual therapy  PLAN FOR NEXT SESSION:   Measure.  Begin thigh bandaging when short stretch comes   Montie Metro, PT CLT [phone]     05/04/2024, 3:01 PM

## 2024-05-06 ENCOUNTER — Ambulatory Visit (HOSPITAL_COMMUNITY): Payer: Self-pay | Attending: Family Medicine | Admitting: Physical Therapy

## 2024-05-06 DIAGNOSIS — I890 Lymphedema, not elsewhere classified: Secondary | ICD-10-CM | POA: Insufficient documentation

## 2024-05-06 NOTE — Therapy (Signed)
 OUTPATIENT PHYSICAL THERAPY LYMPHEDEMA Treatment   Patient Name: Victoria Cummings MRN: [id number] DOB:1966/02/17, 58 y.o., female Today's Date: 05/06/2024    END OF SESSION:  PT End of Session - 05/06/24 1207     Visit Number 12    Number of Visits 24    Date for PT Re-Evaluation 06/03/24    Authorization Type medicare    Progress Note Due on Visit 20    PT Start Time 1023    PT Stop Time 1208    PT Time Calculation (min) 105 min    Activity Tolerance Patient tolerated treatment well    Behavior During Therapy WFL for tasks assessed/performed              Past Medical History:  Diagnosis Date   Anemia    Bronchitis    DVT (deep venous thrombosis) (HCC)    Hyperlipidemia    Hypertension    Lymph edema    Morbid obesity (HCC)    Pneumonia    No past surgical history on file. Patient Active Problem List   Diagnosis Date Noted   Iron deficiency anemia due to chronic blood loss 04/22/2019   History of heavy vaginal bleeding 04/14/2019   PMB (postmenopausal bleeding) 04/14/2019   Encounter for gynecological examination with Papanicolaou smear of cervix 04/14/2019   Acute pulmonary embolism (HCC)    Anemia    DVT (deep venous thrombosis) (HCC) 10/08/2018    PCP: [facility]  REFERRING PROVIDER: Bucio, Silvio BROCKS, FNP  REFERRING DIAG: lymphedema  THERAPY DIAG:  lymphedema  Rationale for Evaluation and Treatment: Rehabilitation  ONSET DATE: Since she was 14 primary lymphedema  SUBJECTIVE:                                                                                                                                                                                           SUBJECTIVE STATEMENT:  PT states that there is an area on the inside of her Lt ankle that is killing her states it feels like the bandages rubbed her raw.  PERTINENT HISTORY:  morbid obesity, HTN, anemia, remote hx of DVT  PAIN:  Are you having pain? Yes .  NPRS scale:  6/10 Pain location: medial ankle  PRECAUTIONS: Other: cellulitis    WEIGHT BEARING RESTRICTIONS: No  FALLS:  Has patient fallen in last 6 months? No  LIVING ENVIRONMENT: Lives with: lives alone Lives in: House/apartment Has following equipment at home: Single point cane  OCCUPATION: not employed   PATIENT GOALS:  I want my legs to get smaller and to walk better.     OBJECTIVE: Note: Objective measures were  completed at Evaluation unless otherwise noted.  COGNITION: Overall cognitive status: Within functional limits for tasks assessed   PALPATION: Noted induration   OBSERVATIONS / OTHER ASSESSMENTS: noted    LYMPHEDEMA ASSESSMENTS:     LE LANDMARK RIGHT-sitting Eval6/4/25 04/15/24 04/23/24 04/29/24 05/06/24  At groin       30 cm proximal to suprapatella       20 cm proximal to suprapatella 106.5 105 106.5  103.3 103  10 cm proximal to suprapatella 104.5 104.3 106.2 102.4 102  At midpatella / popliteal crease 61.5 under lobe 59 60 59 59  30 cm proximal to floor at lateral plantar foot 87 84.5 69 71 69  20 cm proximal to floor at lateral plantar foot 75 75 84 79.8 79.9  10 cm proximal to floor at lateral plantar foot 64.9 63.2 65.8 65 63  Circumference of ankle/heel 41.5 42 40 39.5 40  5 cm proximal to 1st MTP joint 33 31.5 32 31.9 31  Across MTP joint 28.8 31 29  28.5 26.5  Around proximal great toe       (Blank rows = not tested)  LE [facility] LEFT Eval6/4/25 04/15/24 04/23/24 04/29/24 05/06/24  At groin       30 cm proximal to suprapatella       20 cm proximal to suprapatella 147 140 144 143 142  10 cm proximal to suprapatella 129.8 129.5 131 129.8 130  At midpatella / popliteal crease 79 79 75 68 68  30 cm proximal to floor at lateral plantar foot 73.5 58 68 69 70  20 cm proximal to floor at lateral plantar foot 78.5 71 74 70 69.5  10 cm proximal to floor at lateral plantar foot 75.8 62 64 64 62  Circumference of ankle/heel 42 41.2 42 41 40.5  5 cm proximal to 1st  MTP joint 32.8 32.2 32.2 31.6 31.5  Across MTP joint 27.8 28.3 28.7 28.8 27  Around proximal great toe       (Blank rows = not tested)  FUNCTIONAL TESTS: 04/08/24  30 second sit to stand: 4 1:30 minute walk test: 76 feet.  Pt uses a cane and had to rest once.      TODAY'S TREATMENT:     DATE: 05/06/24: measured.  Cleansed and moisturized BLE Manual to include short neck, axillary nodes, deep and superficial abdominal axillary/inguinal anastomosis followed by B LE  Compression bandaging from toes to knee B using multilayer short stretch bandaging and 1/2 foam with an additional 10 cm to each leg. .   Toe wrap to Lt foot.  Media Information 04/27/24     Media Information   Document Information Evaluation 04/08/24                                                                                                                                  .   PATIENT EDUCATION:  04/29/24:  Cervical ROM, cervical retraction, shoulder  rolls, trunk Side bend.  04/10/24:  The benefits of completing self manual specifically working on moving fluid from inner to outer thigh . Education details: four aspects to treating lymphedema to include 1)Skin care, 2)exercise, 3)manual, 4) compression .   Person educated: Patient Education method: Explanation, Verbal cues, and Handouts Education comprehension: verbalized understanding  HOME EXERCISE PROGRAM: 04/29/24: Cervical ROM, cervical retraction, shoulder rolls, trunk Side bend.  04/10/24:   Ankle pumps, Long arc quad, hip ab/adduction, marching in place, diaphragm breathing.    ASSESSMENT:  CLINICAL IMPRESSION:   Therapist inspected medial ankle of Lt foot once pt was on the bed.  There is a slight slit in the skin.  Therapist cleansed then placed tegaderm over this area to prevent friction.  Therapist padded this area well with cotton.  The pt foot is easier bandaged while she is supine therefore therapist bandaged pt Lt foot with the roman sandal  prior to foam at lower leg.  Pt Rt LE has decreased slightly with no change in LT LE; we are still awaiting the 12 cm short stretch bandages to attempt bandaging the this areas.  Therapist contacted [facility] re trim to fit.  Pt will need to be measured and [facility] needs pt to go to [place], however, pt has no way to get to [place].  Therapist contacted [facility] to see if they could come to a treatment time here in the clinic.  No response at this time.     OBJECTIVE IMPAIRMENTS: decreased activity tolerance, decreased mobility, difficulty walking, increased edema, and obesity.   ACTIVITY LIMITATIONS: bathing, toileting, dressing, hygiene/grooming, and locomotion level  PARTICIPATION LIMITATIONS: cleaning, shopping, community activity, and occupation  PERSONAL FACTORS: Fitness, Time since onset of injury/illness/exacerbation, and 1 comorbidity: obesity are also affecting patient's functional outcome.   REHAB POTENTIAL: Good  CLINICAL DECISION MAKING: Evolving/moderate complexity  EVALUATION COMPLEXITY: Moderate  GOALS: Goals reviewed with patient? No  SHORT TERM GOALS: Target date: 05/06/24  PT to be I in skin care to decrease the risk of cellulitis Baseline: Goal status: on-going   2.  Pt to be I in HEP in order to improve lymphatic circulation Baseline:  Goal status: met  3.  Pt skin integrity to have improved noted by skin no longer flaking off Baseline:  Goal status: on-going  4.  PT to have lost 4 cm from LE measurement to decrease risk of cellulitis  Baseline:  Goal status:partially met    LONG TERM GOALS: Target date: 06/03/24  Pt to be I in donning a trim to fit compression garment Baseline:  Goal status: on-going  2.  PT to be using her pump Baseline:  Goal status:on-going  3.  PT to have lost 10 cm or more from LE measurement to allow improved mobilityu Baseline:  Goal status: on-going  4.  Pt to be able to complete 8 sit to stand in 30 minutes.   Baseline:   Goal status: on-going   PLAN:  PT FREQUENCY: 3x/week  PT DURATION: 6 weeks  PLANNED INTERVENTIONS: 97110-Therapeutic exercises, 97535- Self Care, and 02859- Manual therapy  PLAN FOR NEXT SESSION: Inspect medial aspect of Lt ankle to see how wound is doing.   Begin thigh bandaging when short stretch comes   Montie Metro, PT CLT [phone]     05/06/2024, 12:08 PM

## 2024-05-11 ENCOUNTER — Encounter (HOSPITAL_COMMUNITY): Payer: Self-pay | Admitting: Physical Therapy

## 2024-05-12 ENCOUNTER — Encounter (HOSPITAL_COMMUNITY): Payer: Self-pay | Admitting: Physical Therapy

## 2024-05-13 ENCOUNTER — Ambulatory Visit (HOSPITAL_COMMUNITY): Payer: Self-pay | Admitting: Physical Therapy

## 2024-05-13 ENCOUNTER — Telehealth (HOSPITAL_COMMUNITY): Payer: Self-pay | Admitting: Physical Therapy

## 2024-05-13 DIAGNOSIS — I890 Lymphedema, not elsewhere classified: Secondary | ICD-10-CM | POA: Diagnosis not present

## 2024-05-13 NOTE — Telephone Encounter (Signed)
 Spoke to Mildred at [employer].  Cut to fit goes to 125 cm and currently pt is max of 140 cm in Lt thigh.  Will definitely fit on Rt LE at this time.  Jason requesting LE length measurements and will order and come to visit next week to fit patient.    Greig KATHEE Fuse, PTA/CLT [facility] Ph: [phone]

## 2024-05-13 NOTE — Therapy (Signed)
 OUTPATIENT PHYSICAL THERAPY LYMPHEDEMA Treatment   Patient Name: Victoria Cummings MRN: [id number] DOB:Nov 26, 1965, 58 y.o., female Today's Date: 05/13/2024    END OF SESSION:  PT End of Session - 05/13/24 1148     Visit Number 13    Number of Visits 24    Date for PT Re-Evaluation 06/03/24    Authorization Type medicare    Progress Note Due on Visit 20    PT Start Time 1015    PT Stop Time 1140    PT Time Calculation (min) 85 min    Activity Tolerance Patient tolerated treatment well    Behavior During Therapy WFL for tasks assessed/performed              Past Medical History:  Diagnosis Date   Anemia    Bronchitis    DVT (deep venous thrombosis) (HCC)    Hyperlipidemia    Hypertension    Lymph edema    Morbid obesity (HCC)    Pneumonia    No past surgical history on file. Patient Active Problem List   Diagnosis Date Noted   Iron deficiency anemia due to chronic blood loss 04/22/2019   History of heavy vaginal bleeding 04/14/2019   PMB (postmenopausal bleeding) 04/14/2019   Encounter for gynecological examination with Papanicolaou smear of cervix 04/14/2019   Acute pulmonary embolism (HCC)    Anemia    DVT (deep venous thrombosis) (HCC) 10/08/2018    PCP: [facility]  REFERRING PROVIDER: Bucio, Elsa C, FNP  REFERRING DIAG: lymphedema  THERAPY DIAG:  lymphedema  Rationale for Evaluation and Treatment: Rehabilitation  ONSET DATE: Since she was 14 primary lymphedema  SUBJECTIVE:                                                                                                                                                                                           SUBJECTIVE STATEMENT:  PT states she is doing well today.   PERTINENT HISTORY:  morbid obesity, HTN, anemia, remote hx of DVT  PAIN:  Are you having pain? Yes .  NPRS scale: 6/10 Pain location: medial ankle  PRECAUTIONS: Other: cellulitis    WEIGHT BEARING RESTRICTIONS:  No  FALLS:  Has patient fallen in last 6 months? No  LIVING ENVIRONMENT: Lives with: lives alone Lives in: House/apartment Has following equipment at home: Single point cane  OCCUPATION: not employed   PATIENT GOALS:  I want my legs to get smaller and to walk better.     OBJECTIVE: Note: Objective measures were completed at Evaluation unless otherwise noted.  COGNITION: Overall cognitive status: Within functional limits for tasks assessed  PALPATION: Noted induration   OBSERVATIONS / OTHER ASSESSMENTS: noted    LYMPHEDEMA ASSESSMENTS:     LE LANDMARK RIGHT-sitting Eval6/4/25 04/15/24 04/23/24 04/29/24 05/06/24 Right sitting 05/13/24  At groin        30 cm proximal to suprapatella        20 cm proximal to suprapatella 106.5 105 106.5  103.3 103 100  10 cm proximal to suprapatella 104.5 104.3 106.2 102.4 102 97  At midpatella / popliteal crease 61.5 under lobe 59 60 59 59 58  30 cm proximal to floor at lateral plantar foot 87 84.5 69 71 69 81  20 cm proximal to floor at lateral plantar foot 75 75 84 79.8 79.9 68  10 cm proximal to floor at lateral plantar foot 64.9 63.2 65.8 65 63 61  Circumference of ankle/heel 41.5 42 40 39.5 40 10  5 cm proximal to 1st MTP joint 33 31.5 32 31.9 31 31   Across MTP joint 28.8 31 29  28.5 26.5 29.5  Around proximal great toe        (Blank rows = not tested)  LE [facility] LEFT Eval6/4/25 04/15/24 04/23/24 04/29/24 05/06/24 Left sitting 05/13/24  At groin        30 cm proximal to suprapatella        20 cm proximal to suprapatella 147 140 144 143 142 138  10 cm proximal to suprapatella 129.8 129.5 131 129.8 130 128  At midpatella / popliteal crease 79 79 75 68 68 68  30 cm proximal to floor at lateral plantar foot 73.5 58 68 69 70 70  20 cm proximal to floor at lateral plantar foot 78.5 71 74 70 69.5 68  10 cm proximal to floor at lateral plantar foot 75.8 62 64 64 62 62  Circumference of ankle/heel 42 41.2 42 41 40.5 40  5 cm proximal to 1st  MTP joint 32.8 32.2 32.2 31.6 31.5 31  Across MTP joint 27.8 28.3 28.7 28.8 27 28   Around proximal great toe        (Blank rows = not tested)  FUNCTIONAL TESTS: 04/08/24  30 second sit to stand: 4 1:30 minute walk test: 76 feet.  Pt uses a cane and had to rest once.      TODAY'S TREATMENT:     DATE: 05/13/24: Measurements (see above chart)  Cleansed bil LE and moisturized with aquaphor Manual to include short neck, axillary nodes, deep and superficial abdominal axillary/inguinal anastomosis followed by B LE for anterior aspect only Compression bandaging from toes to knee B using multilayer short stretch bandaging and 1/2 foam with an additional 10 cm to each leg. #12 netting Rt, #9 Lt.   Toe wrap to Lt foot only.  05/06/24: measured.  Cleansed and moisturized BLE Manual to include short neck, axillary nodes, deep and superficial abdominal axillary/inguinal anastomosis followed by B LE  Compression bandaging from toes to knee B using multilayer short stretch bandaging and 1/2 foam with an additional 10 cm to each leg. .   Toe wrap to Lt foot.    PATIENT EDUCATION:  04/29/24:  Cervical ROM, cervical retraction, shoulder rolls, trunk Side bend.  04/10/24:  The benefits of completing self manual specifically working on moving fluid from inner to outer thigh . Education details: four aspects to treating lymphedema to include 1)Skin care, 2)exercise, 3)manual, 4) compression .   Person educated: Patient Education method: Explanation, Verbal cues, and Handouts Education comprehension: verbalized understanding  HOME EXERCISE PROGRAM: 04/29/24: Cervical ROM,  cervical retraction, shoulder rolls, trunk Side bend.  04/10/24:   Ankle pumps, Long arc quad, hip ab/adduction, marching in place, diaphragm breathing.    ASSESSMENT:  CLINICAL IMPRESSION:    Medial ankle with Tegaderm still intact.  Old stagnant drainage present beneath.  Therapist cleansed and monitored while completing manual but  with no drainage or opening present.  Perimeter was macerated from drainage but no new drainage or openings present.  Hydrofiber and cotton placed in skin fold to protect from bandaging following. Therapist called and left another message for [facility] regarding measuring for cut to fit and will try again later in day.  Bil LE cleansed well following manual and moisturized distal LE's.  Measurements today indicate not much of a change from last week, however most are slightly down instead of up.     OBJECTIVE IMPAIRMENTS: decreased activity tolerance, decreased mobility, difficulty walking, increased edema, and obesity.   ACTIVITY LIMITATIONS: bathing, toileting, dressing, hygiene/grooming, and locomotion level  PARTICIPATION LIMITATIONS: cleaning, shopping, community activity, and occupation  PERSONAL FACTORS: Fitness, Time since onset of injury/illness/exacerbation, and 1 comorbidity: obesity are also affecting patient's functional outcome.   REHAB POTENTIAL: Good  CLINICAL DECISION MAKING: Evolving/moderate complexity  EVALUATION COMPLEXITY: Moderate  GOALS: Goals reviewed with patient? No  SHORT TERM GOALS: Target date: 05/06/24  PT to be I in skin care to decrease the risk of cellulitis Baseline: Goal status: on-going   2.  Pt to be I in HEP in order to improve lymphatic circulation Baseline:  Goal status: met  3.  Pt skin integrity to have improved noted by skin no longer flaking off Baseline:  Goal status: on-going  4.  PT to have lost 4 cm from LE measurement to decrease risk of cellulitis  Baseline:  Goal status:partially met    LONG TERM GOALS: Target date: 06/03/24  Pt to be I in donning a trim to fit compression garment Baseline:  Goal status: on-going  2.  PT to be using her pump Baseline:  Goal status:on-going  3.  PT to have lost 10 cm or more from LE measurement to allow improved mobilityu Baseline:  Goal status: on-going  4.  Pt to be able to complete 8  sit to stand in 30 minutes.   Baseline:  Goal status: on-going   PLAN:  PT FREQUENCY: 3x/week  PT DURATION: 6 weeks  PLANNED INTERVENTIONS: 97110-Therapeutic exercises, 97535- Self Care, and 02859- Manual therapy  PLAN FOR NEXT SESSION: keep an eye on Lt medial ankle due to high moisture/tendency for skin breakdown.  Follow up with therapist for cut to fit vs short stretch for thighs as unsure bandaging will remain on thighs.    Greig KATHEE Fuse, PTA/CLT [facility] Ph: [phone]  05/13/2024, 11:50 AM

## 2024-05-15 ENCOUNTER — Encounter (HOSPITAL_COMMUNITY): Payer: Self-pay

## 2024-05-15 ENCOUNTER — Ambulatory Visit (HOSPITAL_COMMUNITY): Payer: Self-pay

## 2024-05-15 DIAGNOSIS — I890 Lymphedema, not elsewhere classified: Secondary | ICD-10-CM | POA: Diagnosis not present

## 2024-05-15 NOTE — Therapy (Signed)
 OUTPATIENT PHYSICAL THERAPY LYMPHEDEMA Treatment   Patient Name: Nikko Quast MRN: [id number] DOB:Nov 14, 1965, 58 y.o., female Today's Date: 05/15/2024    END OF SESSION:  PT End of Session - 05/15/24 1213     Visit Number 14    Number of Visits 24    Date for PT Re-Evaluation 06/03/24    Progress Note Due on Visit 20    PT Start Time 1018    PT Stop Time 1145    PT Time Calculation (min) 87 min    Activity Tolerance Patient tolerated treatment well    Behavior During Therapy WFL for tasks assessed/performed              Past Medical History:  Diagnosis Date   Anemia    Bronchitis    DVT (deep venous thrombosis) (HCC)    Hyperlipidemia    Hypertension    Lymph edema    Morbid obesity (HCC)    Pneumonia    History reviewed. No pertinent surgical history. Patient Active Problem List   Diagnosis Date Noted   Iron deficiency anemia due to chronic blood loss 04/22/2019   History of heavy vaginal bleeding 04/14/2019   PMB (postmenopausal bleeding) 04/14/2019   Encounter for gynecological examination with Papanicolaou smear of cervix 04/14/2019   Acute pulmonary embolism (HCC)    Anemia    DVT (deep venous thrombosis) (HCC) 10/08/2018    PCP: [facility]  REFERRING PROVIDER: Bucio, Elsa C, FNP  REFERRING DIAG: lymphedema  THERAPY DIAG:  lymphedema  Rationale for Evaluation and Treatment: Rehabilitation  ONSET DATE: Since she was 14 primary lymphedema  SUBJECTIVE:                                                                                                                                                                                           SUBJECTIVE STATEMENT:  PT states she is doing well today.   PERTINENT HISTORY:  morbid obesity, HTN, anemia, remote hx of DVT  PAIN:  Are you having pain? Yes .  NPRS scale: 6/10 Pain location: medial ankle  PRECAUTIONS: Other: cellulitis    WEIGHT BEARING RESTRICTIONS: No  FALLS:  Has  patient fallen in last 6 months? No  LIVING ENVIRONMENT: Lives with: lives alone Lives in: House/apartment Has following equipment at home: Single point cane  OCCUPATION: not employed   PATIENT GOALS:  I want my legs to get smaller and to walk better.     OBJECTIVE: Note: Objective measures were completed at Evaluation unless otherwise noted.  COGNITION: Overall cognitive status: Within functional limits for tasks assessed   PALPATION: Noted induration   OBSERVATIONS /  OTHER ASSESSMENTS: noted    LYMPHEDEMA ASSESSMENTS:     LE LANDMARK RIGHT-sitting Eval6/4/25 04/15/24 04/23/24 04/29/24 05/06/24 Right sitting 05/13/24  At groin        30 cm proximal to suprapatella        20 cm proximal to suprapatella 106.5 105 106.5  103.3 103 100  10 cm proximal to suprapatella 104.5 104.3 106.2 102.4 102 97  At midpatella / popliteal crease 61.5 under lobe 59 60 59 59 58  30 cm proximal to floor at lateral plantar foot 87 84.5 69 71 69 81  20 cm proximal to floor at lateral plantar foot 75 75 84 79.8 79.9 68  10 cm proximal to floor at lateral plantar foot 64.9 63.2 65.8 65 63 61  Circumference of ankle/heel 41.5 42 40 39.5 40 10  5 cm proximal to 1st MTP joint 33 31.5 32 31.9 31 31   Across MTP joint 28.8 31 29  28.5 26.5 29.5  Around proximal great toe        (Blank rows = not tested)  LE [facility] LEFT Eval6/4/25 04/15/24 04/23/24 04/29/24 05/06/24 Left sitting 05/13/24  At groin        30 cm proximal to suprapatella        20 cm proximal to suprapatella 147 140 144 143 142 138  10 cm proximal to suprapatella 129.8 129.5 131 129.8 130 128  At midpatella / popliteal crease 79 79 75 68 68 68  30 cm proximal to floor at lateral plantar foot 73.5 58 68 69 70 70  20 cm proximal to floor at lateral plantar foot 78.5 71 74 70 69.5 68  10 cm proximal to floor at lateral plantar foot 75.8 62 64 64 62 62  Circumference of ankle/heel 42 41.2 42 41 40.5 40  5 cm proximal to 1st MTP joint 32.8 32.2  32.2 31.6 31.5 31  Across MTP joint 27.8 28.3 28.7 28.8 27 28   Around proximal great toe        (Blank rows = not tested)  FUNCTIONAL TESTS: 04/08/24  30 second sit to stand: 4 1:30 minute walk test: 76 feet.  Pt uses a cane and had to rest once.      TODAY'S TREATMENT:     DATE: 05/13/24: Measurements (see above chart)  Cleansed bil LE and moisturized with aquaphor Manual to include short neck, axillary nodes, deep and superficial abdominal axillary/inguinal anastomosis followed by B LE for anterior aspect only Compression bandaging from toes to knee B using multilayer short stretch bandaging and 1/2 foam with an additional 10 cm to each leg. #12 netting Rt, #9 Lt.   Toe wrap to Lt foot only.  05/06/24: measured.  Cleansed and moisturized BLE Manual to include short neck, axillary nodes, deep and superficial abdominal axillary/inguinal anastomosis followed by B LE  Compression bandaging from toes to knee B using multilayer short stretch bandaging and 1/2 foam with an additional 10 cm to each leg. .   Toe wrap to Lt foot.    PATIENT EDUCATION:  04/29/24:  Cervical ROM, cervical retraction, shoulder rolls, trunk Side bend.  04/10/24:  The benefits of completing self manual specifically working on moving fluid from inner to outer thigh . Education details: four aspects to treating lymphedema to include 1)Skin care, 2)exercise, 3)manual, 4) compression .   Person educated: Patient Education method: Explanation, Verbal cues, and Handouts Education comprehension: verbalized understanding  HOME EXERCISE PROGRAM: 04/29/24: Cervical ROM, cervical retraction, shoulder rolls, trunk Side bend.  04/10/24:   Ankle pumps, Long arc quad, hip ab/adduction, marching in place, diaphragm breathing.    ASSESSMENT:  CLINICAL IMPRESSION:    Lt medial ankle appears intact this session no raw skin or drainage present.  Measured leg length for cut to fit dressings and fax was successful to [place] at [employer].   Manual decongestive techniques complete anterior only.  Improved skin integrity and reduction of induration medial thighs though still present.  BLE cleansed and moisturized well prior application of multilayer short stretch bandages with 1/2in foam.  Reports of comfort at [facility].   OBJECTIVE IMPAIRMENTS: decreased activity tolerance, decreased mobility, difficulty walking, increased edema, and obesity.   ACTIVITY LIMITATIONS: bathing, toileting, dressing, hygiene/grooming, and locomotion level  PARTICIPATION LIMITATIONS: cleaning, shopping, community activity, and occupation  PERSONAL FACTORS: Fitness, Time since onset of injury/illness/exacerbation, and 1 comorbidity: obesity are also affecting patient's functional outcome.   REHAB POTENTIAL: Good  CLINICAL DECISION MAKING: Evolving/moderate complexity  EVALUATION COMPLEXITY: Moderate  GOALS: Goals reviewed with patient? No  SHORT TERM GOALS: Target date: 05/06/24  PT to be I in skin care to decrease the risk of cellulitis Baseline: Goal status: on-going   2.  Pt to be I in HEP in order to improve lymphatic circulation Baseline:  Goal status: met  3.  Pt skin integrity to have improved noted by skin no longer flaking off Baseline:  Goal status: on-going  4.  PT to have lost 4 cm from LE measurement to decrease risk of cellulitis  Baseline:  Goal status:partially met    LONG TERM GOALS: Target date: 06/03/24  Pt to be I in donning a trim to fit compression garment Baseline:  Goal status: on-going  2.  PT to be using her pump Baseline:  Goal status:on-going  3.  PT to have lost 10 cm or more from LE measurement to allow improved mobilityu Baseline:  Goal status: on-going  4.  Pt to be able to complete 8 sit to stand in 30 minutes.   Baseline:  Goal status: on-going   PLAN:  PT FREQUENCY: 3x/week  PT DURATION: 6 weeks  PLANNED INTERVENTIONS: 97110-Therapeutic exercises, 97535- Self Care, and 02859- Manual  therapy  PLAN FOR NEXT SESSION: keep an eye on Lt medial ankle due to high moisture/tendency for skin breakdown.  Follow up with therapist for cut to fit vs short stretch for thighs as unsure bandaging will remain on thighs.    Augustin Mclean, LPTA/CLT; [facility] [phone]   05/15/2024, 4:21 PM

## 2024-05-18 ENCOUNTER — Ambulatory Visit (HOSPITAL_COMMUNITY): Payer: Self-pay | Admitting: Physical Therapy

## 2024-05-18 DIAGNOSIS — I890 Lymphedema, not elsewhere classified: Secondary | ICD-10-CM | POA: Diagnosis not present

## 2024-05-18 NOTE — Therapy (Signed)
 OUTPATIENT PHYSICAL THERAPY LYMPHEDEMA Treatment   Patient Name: Victoria Cummings MRN: [id number] DOB:08-23-1966, 58 y.o., female Today's Date: 05/18/2024    END OF SESSION:  PT End of Session - 05/18/24 1448     Visit Number 15    Number of Visits 24    Date for PT Re-Evaluation 06/03/24    Progress Note Due on Visit 20    PT Start Time 1304    PT Stop Time 1427    PT Time Calculation (min) 83 min    Activity Tolerance Patient tolerated treatment well    Behavior During Therapy WFL for tasks assessed/performed               Past Medical History:  Diagnosis Date   Anemia    Bronchitis    DVT (deep venous thrombosis) (HCC)    Hyperlipidemia    Hypertension    Lymph edema    Morbid obesity (HCC)    Pneumonia    No past surgical history on file. Patient Active Problem List   Diagnosis Date Noted   Iron deficiency anemia due to chronic blood loss 04/22/2019   History of heavy vaginal bleeding 04/14/2019   PMB (postmenopausal bleeding) 04/14/2019   Encounter for gynecological examination with Papanicolaou smear of cervix 04/14/2019   Acute pulmonary embolism (HCC)    Anemia    DVT (deep venous thrombosis) (HCC) 10/08/2018    PCP: [facility]  REFERRING PROVIDER: Bucio, Elsa C, FNP  REFERRING DIAG: lymphedema  THERAPY DIAG:  lymphedema  Rationale for Evaluation and Treatment: Rehabilitation  ONSET DATE: Since she was 14 primary lymphedema  SUBJECTIVE:                                                                                                                                                                                           SUBJECTIVE STATEMENT:  PT states she is doing well today without any issues.     PERTINENT HISTORY:  morbid obesity, HTN, anemia, remote hx of DVT  PAIN:  Are you having pain? Yes .  NPRS scale: 6/10 Pain location: medial ankle  PRECAUTIONS: Other: cellulitis    WEIGHT BEARING RESTRICTIONS:  No  FALLS:  Has patient fallen in last 6 months? No  LIVING ENVIRONMENT: Lives with: lives alone Lives in: House/apartment Has following equipment at home: Single point cane  OCCUPATION: not employed   PATIENT GOALS:  I want my legs to get smaller and to walk better.     OBJECTIVE: Note: Objective measures were completed at Evaluation unless otherwise noted.  COGNITION: Overall cognitive status: Within functional limits for tasks assessed  PALPATION: Noted induration   OBSERVATIONS / OTHER ASSESSMENTS: noted    LYMPHEDEMA ASSESSMENTS:     LE LANDMARK RIGHT-sitting Eval6/4/25 04/15/24 04/23/24 04/29/24 05/06/24 Right sitting 05/13/24  At groin        30 cm proximal to suprapatella        20 cm proximal to suprapatella 106.5 105 106.5  103.3 103 100  10 cm proximal to suprapatella 104.5 104.3 106.2 102.4 102 97  At midpatella / popliteal crease 61.5 under lobe 59 60 59 59 58  30 cm proximal to floor at lateral plantar foot 87 84.5 69 71 69 81  20 cm proximal to floor at lateral plantar foot 75 75 84 79.8 79.9 68  10 cm proximal to floor at lateral plantar foot 64.9 63.2 65.8 65 63 61  Circumference of ankle/heel 41.5 42 40 39.5 40 10  5 cm proximal to 1st MTP joint 33 31.5 32 31.9 31 31   Across MTP joint 28.8 31 29  28.5 26.5 29.5  Around proximal great toe        (Blank rows = not tested)  LE [facility] LEFT Eval6/4/25 04/15/24 04/23/24 04/29/24 05/06/24 Left sitting 05/13/24  At groin        30 cm proximal to suprapatella        20 cm proximal to suprapatella 147 140 144 143 142 138  10 cm proximal to suprapatella 129.8 129.5 131 129.8 130 128  At midpatella / popliteal crease 79 79 75 68 68 68  30 cm proximal to floor at lateral plantar foot 73.5 58 68 69 70 70  20 cm proximal to floor at lateral plantar foot 78.5 71 74 70 69.5 68  10 cm proximal to floor at lateral plantar foot 75.8 62 64 64 62 62  Circumference of ankle/heel 42 41.2 42 41 40.5 40  5 cm proximal to 1st  MTP joint 32.8 32.2 32.2 31.6 31.5 31  Across MTP joint 27.8 28.3 28.7 28.8 27 28   Around proximal great toe        (Blank rows = not tested)  FUNCTIONAL TESTS: 04/08/24  30 second sit to stand: 4 1:30 minute walk test: 76 feet.  Pt uses a cane and had to rest once.      TODAY'S TREATMENT:     DATE: 05/18/24:   Manual to include short neck, axillary nodes, deep and superficial abdominal axillary/inguinal anastomosis followed by B LE anterior only Moisturized LE's f/b Compression bandaging from toes to knee B using multilayer short stretch bandaging and 1/2 foam with an additional 10 cm to each leg.  05/13/24: Measurements (see above chart)  Cleansed bil LE and moisturized with aquaphor Manual to include short neck, axillary nodes, deep and superficial abdominal axillary/inguinal anastomosis followed by B LE for anterior aspect only Compression bandaging from toes to knee B using multilayer short stretch bandaging and 1/2 foam with an additional 10 cm to each leg. #12 netting Rt, #9 Lt.   Toe wrap to Lt foot only.  05/06/24: measured.  Cleansed and moisturized BLE Manual to include short neck, axillary nodes, deep and superficial abdominal axillary/inguinal anastomosis followed by B LE  Compression bandaging from toes to knee B using multilayer short stretch bandaging and 1/2 foam with an additional 10 cm to each leg. .   Toe wrap to Lt foot.    PATIENT EDUCATION:  04/29/24:  Cervical ROM, cervical retraction, shoulder rolls, trunk Side bend.  04/10/24:  The benefits of completing self manual specifically  working on moving fluid from inner to outer thigh . Education details: four aspects to treating lymphedema to include 1)Skin care, 2)exercise, 3)manual, 4) compression .   Person educated: Patient Education method: Explanation, Verbal cues, and Handouts Education comprehension: verbalized understanding  HOME EXERCISE PROGRAM: 04/29/24: Cervical ROM, cervical retraction, shoulder  rolls, trunk Side bend.  04/10/24:   Ankle pumps, Long arc quad, hip ab/adduction, marching in place, diaphragm breathing.    ASSESSMENT:  CLINICAL IMPRESSION:    Manual decongestive techniques complete anterior only, moisturized and continued with short stretch to distal bil LE's.  Cut to fit should be in this week; [facility] to contact clinic and set up time to come when pt is here.  Reports of comfort at [facility].   OBJECTIVE IMPAIRMENTS: decreased activity tolerance, decreased mobility, difficulty walking, increased edema, and obesity.   ACTIVITY LIMITATIONS: bathing, toileting, dressing, hygiene/grooming, and locomotion level  PARTICIPATION LIMITATIONS: cleaning, shopping, community activity, and occupation  PERSONAL FACTORS: Fitness, Time since onset of injury/illness/exacerbation, and 1 comorbidity: obesity are also affecting patient's functional outcome.   REHAB POTENTIAL: Good  CLINICAL DECISION MAKING: Evolving/moderate complexity  EVALUATION COMPLEXITY: Moderate  GOALS: Goals reviewed with patient? No  SHORT TERM GOALS: Target date: 05/06/24  PT to be I in skin care to decrease the risk of cellulitis Baseline: Goal status: on-going   2.  Pt to be I in HEP in order to improve lymphatic circulation Baseline:  Goal status: met  3.  Pt skin integrity to have improved noted by skin no longer flaking off Baseline:  Goal status: on-going  4.  PT to have lost 4 cm from LE measurement to decrease risk of cellulitis  Baseline:  Goal status:partially met    LONG TERM GOALS: Target date: 06/03/24  Pt to be I in donning a trim to fit compression garment Baseline:  Goal status: on-going  2.  PT to be using her pump Baseline:  Goal status:on-going  3.  PT to have lost 10 cm or more from LE measurement to allow improved mobilityu Baseline:  Goal status: on-going  4.  Pt to be able to complete 8 sit to stand in 30 minutes.   Baseline:  Goal status: on-going   PLAN:  PT  FREQUENCY: 3x/week  PT DURATION: 6 weeks  PLANNED INTERVENTIONS: 97110-Therapeutic exercises, 97535- Self Care, and 02859- Manual therapy  PLAN FOR NEXT SESSION:  [facility] to come out this week when cut to fit is ready.   Follow up with therapist for cut to fit vs short stretch for thighs as unsure bandaging will remain on thighs.  Measure weekly.  Greig KATHEE Fuse, PTA/CLT [facility] Ph: [phone]    05/18/2024, 2:49 PM

## 2024-05-20 ENCOUNTER — Ambulatory Visit (HOSPITAL_COMMUNITY): Payer: Self-pay | Admitting: Physical Therapy

## 2024-05-20 DIAGNOSIS — I890 Lymphedema, not elsewhere classified: Secondary | ICD-10-CM | POA: Diagnosis not present

## 2024-05-20 NOTE — Therapy (Signed)
 OUTPATIENT PHYSICAL THERAPY LYMPHEDEMA Treatment   Patient Name: Victoria Cummings MRN: [id number] DOB:October 31, 1966, 58 y.o., female Today's Date: 05/20/2024    END OF SESSION:  PT End of Session - 05/20/24 1429     Visit Number 16    Number of Visits 24    Date for PT Re-Evaluation 06/03/24    Authorization Type medicare    Progress Note Due on Visit 20    PT Start Time 1245    PT Stop Time 1420    PT Time Calculation (min) 95 min    Activity Tolerance Patient tolerated treatment well    Behavior During Therapy WFL for tasks assessed/performed                Past Medical History:  Diagnosis Date   Anemia    Bronchitis    DVT (deep venous thrombosis) (HCC)    Hyperlipidemia    Hypertension    Lymph edema    Morbid obesity (HCC)    Pneumonia    No past surgical history on file. Patient Active Problem List   Diagnosis Date Noted   Iron deficiency anemia due to chronic blood loss 04/22/2019   History of heavy vaginal bleeding 04/14/2019   PMB (postmenopausal bleeding) 04/14/2019   Encounter for gynecological examination with Papanicolaou smear of cervix 04/14/2019   Acute pulmonary embolism (HCC)    Anemia    DVT (deep venous thrombosis) (HCC) 10/08/2018    PCP: [facility]  REFERRING PROVIDER: Bucio, Silvio BROCKS, FNP  REFERRING DIAG: lymphedema  THERAPY DIAG:  lymphedema  Rationale for Evaluation and Treatment: Rehabilitation  ONSET DATE: Since she was 14 primary lymphedema  SUBJECTIVE:                                                                                                                                                                                           SUBJECTIVE STATEMENT:  PT states that her Rt knee is really bothering her today.  She has continued to do her exercises to the best of her ability.    PERTINENT HISTORY:  morbid obesity, HTN, anemia, remote hx of DVT  PAIN:  Are you having pain? Yes .  NPRS scale:  6/10 Pain location: medial ankle  PRECAUTIONS: Other: cellulitis    WEIGHT BEARING RESTRICTIONS: No  FALLS:  Has patient fallen in last 6 months? No  LIVING ENVIRONMENT: Lives with: lives alone Lives in: House/apartment Has following equipment at home: Single point cane  OCCUPATION: not employed   PATIENT GOALS:  I want my legs to get smaller and to walk better.     OBJECTIVE: Note: Objective  measures were completed at Evaluation unless otherwise noted.  COGNITION: Overall cognitive status: Within functional limits for tasks assessed   PALPATION: Noted induration   OBSERVATIONS / OTHER ASSESSMENTS: noted    LYMPHEDEMA ASSESSMENTS:     LE LANDMARK RIGHT-sitting Eval6/4/25 04/15/24 04/23/24 04/29/24 05/06/24 Right sitting 05/13/24 05/19/24  At groin         30 cm proximal to suprapatella         20 cm proximal to suprapatella 106.5 105 106.5  103.3 103 100 102  10 cm proximal to suprapatella 104.5 104.3 106.2 102.4 102 97 99  At midpatella / popliteal crease 61.5 under lobe 59 60 59 59 58 60  30 cm proximal to floor at lateral plantar foot 87 84.5 69 71 69 81 70  20 cm proximal to floor at lateral plantar foot 75 75 84 79.8 79.9 68 79  10 cm proximal to floor at lateral plantar foot 64.9 63.2 65.8 65 63 61 61.8  Circumference of ankle/heel 41.5 42 40 39.5 40 10 41  5 cm proximal to 1st MTP joint 33 31.5 32 31.9 31 31  31.7  Across MTP joint 28.8 31 29  28.5 26.5 29.5 28.5  Around proximal great toe         (Blank rows = not tested)  LE [facility] LEFT Eval6/4/25 04/15/24 04/23/24 04/29/24 05/06/24 Left sitting 05/13/24 05/19/24  At groin         30 cm proximal to suprapatella         20 cm proximal to suprapatella 147 140 144 143 142 138 136  10 cm proximal to suprapatella 129.8 129.5 131 129.8 130 128 125  At midpatella / popliteal crease 79 79 75 68 68 68 67  30 cm proximal to floor at lateral plantar foot 73.5 58 68 69 70 70 71  20 cm proximal to floor at lateral plantar  foot 78.5 71 74 70 69.5 68 68  10 cm proximal to floor at lateral plantar foot 75.8 62 64 64 62 62 61.5  Circumference of ankle/heel 42 41.2 42 41 40.5 40 38  5 cm proximal to 1st MTP joint 32.8 32.2 32.2 31.6 31.5 31 31.5  Across MTP joint 27.8 28.3 28.7 28.8 27 28 27   Around proximal great toe         (Blank rows = not tested)  FUNCTIONAL TESTS: 04/08/24  30 second sit to stand: 4 1:30 minute walk test: 76 feet.  Pt uses a cane and had to rest once.      TODAY'S TREATMENT:     05/20/24:  Measured  Compression bandaging from toes to thigh B using multilayer short stretch bandaging and 1/2 foam with an additional 10 cm to each lower leg.  Therapist cut 1/2 foam for Rt thigh; unable to cut foam for Lt at this time.  Thigh's were bandaged with pt standing using figure 8 motion to attempt to cover lobules.   PATIENT EDUCATION:  04/29/24:  Cervical ROM, cervical retraction, shoulder rolls, trunk Side bend.  04/10/24:  The benefits of completing self manual specifically working on moving fluid from inner to outer thigh . Education details: four aspects to treating lymphedema to include 1)Skin care, 2)exercise, 3)manual, 4) compression .   Person educated: Patient Education method: Explanation, Verbal cues, and Handouts Education comprehension: verbalized understanding  HOME EXERCISE PROGRAM: 04/29/24: Cervical ROM, cervical retraction, shoulder rolls, trunk Side bend.  04/10/24:   Ankle pumps, Long arc quad, hip ab/adduction, marching in place,  diaphragm breathing.    ASSESSMENT:  CLINICAL IMPRESSION:   12 cm short stretch came in with therapist cutting foam for Rt thigh K-1 netting placed on Rt thigh .  PT used two 5 meter and one 10 meter length of short stretch for pt RT LE.  No foam was used for pt Lt LE.  Therapist used two 10 meter long short stretch for Rt thigh.  There is two more 5 meter if therapist feels that this will be useful next visit.  Therapist stressed to patient that if she  has increased pain she should take the thigh short stretch bandages off.   Due to increased time needed to cut foam and bandage thighs no manual was completed this session  OBJECTIVE IMPAIRMENTS: decreased activity tolerance, decreased mobility, difficulty walking, increased edema, and obesity.   ACTIVITY LIMITATIONS: bathing, toileting, dressing, hygiene/grooming, and locomotion level  PARTICIPATION LIMITATIONS: cleaning, shopping, community activity, and occupation  PERSONAL FACTORS: Fitness, Time since onset of injury/illness/exacerbation, and 1 comorbidity: obesity are also affecting patient's functional outcome.   REHAB POTENTIAL: Good  CLINICAL DECISION MAKING: Evolving/moderate complexity  EVALUATION COMPLEXITY: Moderate  GOALS: Goals reviewed with patient? No  SHORT TERM GOALS: Target date: 05/06/24  PT to be I in skin care to decrease the risk of cellulitis Baseline: Goal status: on-going   2.  Pt to be I in HEP in order to improve lymphatic circulation Baseline:  Goal status: met  3.  Pt skin integrity to have improved noted by skin no longer flaking off Baseline:  Goal status: on-going  4.  PT to have lost 4 cm from LE measurement to decrease risk of cellulitis  Baseline:  Goal status:partially met    LONG TERM GOALS: Target date: 06/03/24  Pt to be I in donning a trim to fit compression garment Baseline:  Goal status: on-going  2.  PT to be using her pump Baseline:  Goal status:on-going  3.  PT to have lost 10 cm or more from LE measurement to allow improved mobilityu Baseline:  Goal status: on-going  4.  Pt to be able to complete 8 sit to stand in 30 minutes.   Baseline:  Goal status: on-going   PLAN:  PT FREQUENCY: 3x/week  PT DURATION: 6 weeks  PLANNED INTERVENTIONS: 97110-Therapeutic exercises, 97535- Self Care, and 02859- Manual therapy  PLAN FOR NEXT SESSION: Complete 15 minutes of manual prior to bandaging next session  Montie Metro,  PT CLT [phone]  [facility]     05/20/2024, 2:43 PM

## 2024-05-22 ENCOUNTER — Ambulatory Visit (HOSPITAL_COMMUNITY): Payer: Self-pay | Admitting: Physical Therapy

## 2024-05-22 DIAGNOSIS — I890 Lymphedema, not elsewhere classified: Secondary | ICD-10-CM | POA: Diagnosis not present

## 2024-05-22 NOTE — Therapy (Signed)
 OUTPATIENT PHYSICAL THERAPY LYMPHEDEMA Treatment   Patient Name: Victoria Cummings MRN: [id number] DOB:[birth year], 58 y.o., female Today's Date: 05/22/2024    END OF SESSION:  PT End of Session - 05/22/24 1203     Visit Number 17    Number of Visits 24    Date for PT Re-Evaluation 06/03/24    Authorization Type medicare    Progress Note Due on Visit 20    PT Start Time 1015    PT Stop Time 1150    PT Time Calculation (min) 95 min    Activity Tolerance Patient tolerated treatment well    Behavior During Therapy WFL for tasks assessed/performed                Past Medical History:  Diagnosis Date   Anemia    Bronchitis    DVT (deep venous thrombosis) (HCC)    Hyperlipidemia    Hypertension    Lymph edema    Morbid obesity (HCC)    Pneumonia    No past surgical history on file. Patient Active Problem List   Diagnosis Date Noted   Iron deficiency anemia due to chronic blood loss 04/22/2019   History of heavy vaginal bleeding 04/14/2019   PMB (postmenopausal bleeding) 04/14/2019   Encounter for gynecological examination with Papanicolaou smear of cervix 04/14/2019   Acute pulmonary embolism (HCC)    Anemia    DVT (deep venous thrombosis) (HCC) 10/08/2018    PCP: [facility]  REFERRING PROVIDER: Bucio, Elsa C, FNP  REFERRING DIAG: lymphedema  THERAPY DIAG:  lymphedema  Rationale for Evaluation and Treatment: Rehabilitation  ONSET DATE: Since she was 14 primary lymphedema  SUBJECTIVE:                                                                                                                                                                                           SUBJECTIVE STATEMENT: PT states that the bandages on her Lt leg only stayed on until she got home.  The Rt leg stayed on fairly well.   PERTINENT HISTORY:  morbid obesity, HTN, anemia, remote hx of DVT  PAIN:  Are you having pain? Yes .  NPRS scale: 6/10 Pain location:  medial ankle  PRECAUTIONS: Other: cellulitis    WEIGHT BEARING RESTRICTIONS: No  FALLS:  Has patient fallen in last 6 months? No  LIVING ENVIRONMENT: Lives with: lives alone Lives in: House/apartment Has following equipment at home: Single point cane  OCCUPATION: not employed   PATIENT GOALS:  I want my legs to get smaller and to walk better.     OBJECTIVE: Note: Objective measures were completed  at Evaluation unless otherwise noted.  COGNITION: Overall cognitive status: Within functional limits for tasks assessed   PALPATION: Noted induration   OBSERVATIONS / OTHER ASSESSMENTS: noted    LYMPHEDEMA ASSESSMENTS:     LE LANDMARK RIGHT-sitting Eval6/4/25 04/15/24 04/23/24 04/29/24 05/06/24 Right sitting 05/13/24 05/19/24  At groin         30 cm proximal to suprapatella         20 cm proximal to suprapatella 106.5 105 106.5  103.3 103 100 102  10 cm proximal to suprapatella 104.5 104.3 106.2 102.4 102 97 99  At midpatella / popliteal crease 61.5 under lobe 59 60 59 59 58 60  30 cm proximal to floor at lateral plantar foot 87 84.5 69 71 69 81 70  20 cm proximal to floor at lateral plantar foot 75 75 84 79.8 79.9 68 79  10 cm proximal to floor at lateral plantar foot 64.9 63.2 65.8 65 63 61 61.8  Circumference of ankle/heel 41.5 42 40 39.5 40 10 41  5 cm proximal to 1st MTP joint 33 31.5 32 31.9 31 31  31.7  Across MTP joint 28.8 31 29  28.5 26.5 29.5 28.5  Around proximal great toe         (Blank rows = not tested)  LE [facility] LEFT Eval6/4/25 04/15/24 04/23/24 04/29/24 05/06/24 Left sitting 05/13/24 05/19/24  At groin         30 cm proximal to suprapatella         20 cm proximal to suprapatella 147 140 144 143 142 138 136  10 cm proximal to suprapatella 129.8 129.5 131 129.8 130 128 125  At midpatella / popliteal crease 79 79 75 68 68 68 67  30 cm proximal to floor at lateral plantar foot 73.5 58 68 69 70 70 71  20 cm proximal to floor at lateral plantar foot 78.5 71 74 70 69.5  68 68  10 cm proximal to floor at lateral plantar foot 75.8 62 64 64 62 62 61.5  Circumference of ankle/heel 42 41.2 42 41 40.5 40 38  5 cm proximal to 1st MTP joint 32.8 32.2 32.2 31.6 31.5 31 31.5  Across MTP joint 27.8 28.3 28.7 28.8 27 28 27   Around proximal great toe         (Blank rows = not tested)  FUNCTIONAL TESTS: 04/08/24  30 second sit to stand: 4 1:30 minute walk test: 76 feet.  Pt uses a cane and had to rest once.      TODAY'S TREATMENT:     05/22/24:  Decongestive manual techniques including supraclavicular, deep and superficial abdominal, inguinal to axillary anastomosis followed by B LE.   Compression bandaging from toes to thigh B using multilayer short stretch bandaging and 1/2 foam .    Thigh's were bandaged with pt standing using figure 8 motion to attempt to cover lobules.   PATIENT EDUCATION:  04/29/24:  Cervical ROM, cervical retraction, shoulder rolls, trunk Side bend.  04/10/24:  The benefits of completing self manual specifically working on moving fluid from inner to outer thigh . Education details: four aspects to treating lymphedema to include 1)Skin care, 2)exercise, 3)manual, 4) compression .   Person educated: Patient Education method: Explanation, Verbal cues, and Handouts Education comprehension: verbalized understanding  HOME EXERCISE PROGRAM: 04/29/24: Cervical ROM, cervical retraction, shoulder rolls, trunk Side bend.  04/10/24:   Ankle pumps, Long arc quad, hip ab/adduction, marching in place, diaphragm breathing.    ASSESSMENT:  CLINICAL IMPRESSION:  Total decongestive techniques completed B.  LE no longer need an extra 10 cm short stretch.  Therapist bandaged Rt thigh foam with pt standing; completing the rest of the thigh bandaging with pt sitting.  Lt LE bandaging completed sitting.  Masking tape was not sticking therefore therapist used medipore tape.   OBJECTIVE IMPAIRMENTS: decreased activity tolerance, decreased mobility, difficulty walking,  increased edema, and obesity.   ACTIVITY LIMITATIONS: bathing, toileting, dressing, hygiene/grooming, and locomotion level  PARTICIPATION LIMITATIONS: cleaning, shopping, community activity, and occupation  PERSONAL FACTORS: Fitness, Time since onset of injury/illness/exacerbation, and 1 comorbidity: obesity are also affecting patient's functional outcome.   REHAB POTENTIAL: Good  CLINICAL DECISION MAKING: Evolving/moderate complexity  EVALUATION COMPLEXITY: Moderate  GOALS: Goals reviewed with patient? No  SHORT TERM GOALS: Target date: 05/06/24  PT to be I in skin care to decrease the risk of cellulitis Baseline: Goal status: met  2.  Pt to be I in HEP in order to improve lymphatic circulation Baseline:  Goal status: met  3.  Pt skin integrity to have improved noted by skin no longer flaking off Baseline:  Goal status: met  4.  PT to have lost 4 cm from LE measurement to decrease risk of cellulitis  Baseline:  Goal status:met    LONG TERM GOALS: Target date: 06/03/24  Pt to be I in donning a trim to fit compression garment Baseline:  Goal status: on-going  2.  PT to be using her pump Baseline:  Goal status:on-going  3.  PT to have lost 10 cm or more from LE measurement to allow improved mobilityu Baseline:  Goal status: on-going  4.  Pt to be able to complete 8 sit to stand in 30 minutes.   Baseline:  Goal status: on-going   PLAN:  PT FREQUENCY: 3x/week  PT DURATION: 6 weeks  PLANNED INTERVENTIONS: 97110-Therapeutic exercises, 97535- Self Care, and 02859- Manual therapy  PLAN FOR NEXT SESSION: Continue with total decongestive therapy Montie Metro, PT CLT  [phone]  [facility]     05/22/2024, 12:12 PM

## 2024-05-25 ENCOUNTER — Encounter (HOSPITAL_COMMUNITY): Payer: Self-pay | Admitting: Physical Therapy

## 2024-05-27 ENCOUNTER — Ambulatory Visit (HOSPITAL_COMMUNITY): Payer: Self-pay | Admitting: Physical Therapy

## 2024-05-27 DIAGNOSIS — I890 Lymphedema, not elsewhere classified: Secondary | ICD-10-CM | POA: Diagnosis not present

## 2024-05-27 NOTE — Therapy (Signed)
 OUTPATIENT PHYSICAL THERAPY LYMPHEDEMA Treatment   Patient Name: Victoria Cummings MRN: [id number] DOB:1966/05/16, 58 y.o., female Today's Date: 05/27/2024    END OF SESSION:  PT End of Session - 05/27/24 1203     Visit Number 18    Number of Visits 24    Date for PT Re-Evaluation 06/03/24    Authorization Type medicare    Progress Note Due on Visit 20    PT Start Time 1013    PT Stop Time 1201    PT Time Calculation (min) 108 min    Activity Tolerance Patient tolerated treatment well    Behavior During Therapy WFL for tasks assessed/performed                 Past Medical History:  Diagnosis Date   Anemia    Bronchitis    DVT (deep venous thrombosis) (HCC)    Hyperlipidemia    Hypertension    Lymph edema    Morbid obesity (HCC)    Pneumonia    No past surgical history on file. Patient Active Problem List   Diagnosis Date Noted   Iron deficiency anemia due to chronic blood loss 04/22/2019   History of heavy vaginal bleeding 04/14/2019   PMB (postmenopausal bleeding) 04/14/2019   Encounter for gynecological examination with Papanicolaou smear of cervix 04/14/2019   Acute pulmonary embolism (HCC)    Anemia    DVT (deep venous thrombosis) (HCC) 10/08/2018    PCP: [facility]  REFERRING PROVIDER: Bucio, Silvio BROCKS, FNP  REFERRING DIAG: lymphedema  THERAPY DIAG:  lymphedema  Rationale for Evaluation and Treatment: Rehabilitation  ONSET DATE: Since she was 14 primary lymphedema  SUBJECTIVE:                                                                                                                                                                                           SUBJECTIVE STATEMENT: PT states that the bandages on her Lt leg continues to fall off the thigh.  It stays on the LE.    The Rt thigh and leg stayed on fairly well.   PERTINENT HISTORY:  morbid obesity, HTN, anemia, remote hx of DVT  PAIN:  Are you having pain? Yes .   NPRS scale: 6/10 Pain location: medial ankle  PRECAUTIONS: Other: cellulitis    WEIGHT BEARING RESTRICTIONS: No  FALLS:  Has patient fallen in last 6 months? No  LIVING ENVIRONMENT: Lives with: lives alone Lives in: House/apartment Has following equipment at home: Single point cane  OCCUPATION: not employed   PATIENT GOALS:  I want my legs to get smaller and to walk better.  OBJECTIVE: Note: Objective measures were completed at Evaluation unless otherwise noted.  COGNITION: Overall cognitive status: Within functional limits for tasks assessed   PALPATION: Noted induration   OBSERVATIONS / OTHER ASSESSMENTS: noted    LYMPHEDEMA ASSESSMENTS:     LE LANDMARK RIGHT-sitting Eval6/4/25 04/15/24 04/23/24 04/29/24 05/06/24 Right sitting 05/13/24 05/19/24 05/27/24  At groin          30 cm proximal to suprapatella          20 cm proximal to suprapatella 106.5 105 106.5  103.3 103 100 102 102.2  10 cm proximal to suprapatella 104.5 104.3 106.2 102.4 102 97 99 99  At midpatella / popliteal crease 61.5 under lobe 59 60 59 59 58 60 58  30 cm proximal to floor at lateral plantar foot 87 84.5 69 71 69 81 70 68  20 cm proximal to floor at lateral plantar foot 75 75 84 79.8 79.9 68 79 77  10 cm proximal to floor at lateral plantar foot 64.9 63.2 65.8 65 63 61 61.8 62  Circumference of ankle/heel 41.5 42 40 39.5 40 10 41 42  5 cm proximal to 1st MTP joint 33 31.5 32 31.9 31 31  31.7 31  Across MTP joint 28.8 31 29  28.5 26.5 29.5 28.5 27.8  Around proximal great toe          (Blank rows = not tested)  LE [facility] LEFT Eval6/4/25 04/15/24 04/23/24 04/29/24 05/06/24 Left sitting 05/13/24 05/19/24 05/27/24  At groin          30 cm proximal to suprapatella          20 cm proximal to suprapatella 147 140 144 143 142 138 136 138  10 cm proximal to suprapatella 129.8 129.5 131 129.8 130 128 125 128  At midpatella / popliteal crease 79 79 75 68 68 68 67 70  30 cm proximal to floor at lateral  plantar foot 73.5 58 68 69 70 70 71 69  20 cm proximal to floor at lateral plantar foot 78.5 71 74 70 69.5 68 68 69  10 cm proximal to floor at lateral plantar foot 75.8 62 64 64 62 62 61.5 60  Circumference of ankle/heel 42 41.2 42 41 40.5 40 38 42  5 cm proximal to 1st MTP joint 32.8 32.2 32.2 31.6 31.5 31 31.5 32  Across MTP joint 27.8 28.3 28.7 28.8 27 28 27 28   Around proximal great toe          (Blank rows = not tested)  FUNCTIONAL TESTS: 04/08/24  30 second sit to stand: 4 1:30 minute walk test: 76 feet.  Pt uses a cane and had to rest once.      TODAY'S TREATMENT:   measurement  05/27/24:  Decongestive manual techniques including supraclavicular, deep and superficial abdominal, inguinal to axillary anastomosis followed by B LE.   Compression bandaging from toes to thigh B using multilayer short stretch bandaging and 1/2 foam .    Thigh's were bandaged with pt standing using figure 8 motion to attempt to cover lobules.   PATIENT EDUCATION:  04/29/24:  Cervical ROM, cervical retraction, shoulder rolls, trunk Side bend.  04/10/24:  The benefits of completing self manual specifically working on moving fluid from inner to outer thigh . Education details: four aspects to treating lymphedema to include 1)Skin care, 2)exercise, 3)manual, 4) compression .   Person educated: Patient Education method: Explanation, Verbal cues, and Handouts Education comprehension: verbalized understanding  HOME EXERCISE PROGRAM: 04/29/24:  Cervical ROM, cervical retraction, shoulder rolls, trunk Side bend.  04/10/24:   Ankle pumps, Long arc quad, hip ab/adduction, marching in place, diaphragm breathing.    ASSESSMENT:  CLINICAL IMPRESSION:      Pt measured with reduction of Rt LE but not Lt.  Therapist able to find netting to fit Lt thigh therefore pt was compression bandage with foam on the LT, hopefully this will allow the compression bandaging to stay up longer on the LT>    Therapist bandaged Rt thigh foam   and knee/thigh connection with pt standing; completing the rest of the thigh bandaging with pt sitting.  Lt LE bandaging completed standing using the assistance of another therapist.     OBJECTIVE IMPAIRMENTS: decreased activity tolerance, decreased mobility, difficulty walking, increased edema, and obesity.   ACTIVITY LIMITATIONS: bathing, toileting, dressing, hygiene/grooming, and locomotion level  PARTICIPATION LIMITATIONS: cleaning, shopping, community activity, and occupation  PERSONAL FACTORS: Fitness, Time since onset of injury/illness/exacerbation, and 1 comorbidity: obesity are also affecting patient's functional outcome.   REHAB POTENTIAL: Good  CLINICAL DECISION MAKING: Evolving/moderate complexity  EVALUATION COMPLEXITY: Moderate  GOALS: Goals reviewed with patient? No  SHORT TERM GOALS: Target date: 05/06/24  PT to be I in skin care to decrease the risk of cellulitis Baseline: Goal status: met  2.  Pt to be I in HEP in order to improve lymphatic circulation Baseline:  Goal status: met  3.  Pt skin integrity to have improved noted by skin no longer flaking off Baseline:  Goal status: met  4.  PT to have lost 4 cm from LE measurement to decrease risk of cellulitis  Baseline:  Goal status:met    LONG TERM GOALS: Target date: 06/03/24  Pt to be I in donning a trim to fit compression garment Baseline:  Goal status: on-going  2.  PT to be using her pump Baseline:  Goal status:on-going  3.  PT to have lost 10 cm or more from LE measurement to allow improved mobilityu Baseline:  Goal status: on-going  4.  Pt to be able to complete 8 sit to stand in 30 minutes.   Baseline:  Goal status: on-going   PLAN:  PT FREQUENCY: 3x/week  PT DURATION: 6 weeks  PLANNED INTERVENTIONS: 97110-Therapeutic exercises, 97535- Self Care, and 02859- Manual therapy  PLAN FOR NEXT SESSION: Continue with total decongestive therapy Montie Metro, PT CLT  [phone]   [facility]     05/27/2024, 12:04 PM

## 2024-05-29 ENCOUNTER — Ambulatory Visit (HOSPITAL_COMMUNITY): Payer: Self-pay | Admitting: Physical Therapy

## 2024-05-29 DIAGNOSIS — I890 Lymphedema, not elsewhere classified: Secondary | ICD-10-CM

## 2024-05-29 NOTE — Therapy (Signed)
 OUTPATIENT PHYSICAL THERAPY LYMPHEDEMA Treatment   Patient Name: Victoria Cummings MRN: [id number] DOB:08-Jan-1966, 58 y.o., female Today's Date: 05/29/2024    END OF SESSION:  PT End of Session - 05/29/24 1214     Visit Number 19    Number of Visits 24    Date for PT Re-Evaluation 06/03/24    Authorization Type medicare    Progress Note Due on Visit 20    PT Start Time 1020    PT Stop Time 1202    PT Time Calculation (min) 102 min    Activity Tolerance Patient tolerated treatment well    Behavior During Therapy WFL for tasks assessed/performed                 Past Medical History:  Diagnosis Date   Anemia    Bronchitis    DVT (deep venous thrombosis) (HCC)    Hyperlipidemia    Hypertension    Lymph edema    Morbid obesity (HCC)    Pneumonia    No past surgical history on file. Patient Active Problem List   Diagnosis Date Noted   Iron deficiency anemia due to chronic blood loss 04/22/2019   History of heavy vaginal bleeding 04/14/2019   PMB (postmenopausal bleeding) 04/14/2019   Encounter for gynecological examination with Papanicolaou smear of cervix 04/14/2019   Acute pulmonary embolism (HCC)    Anemia    DVT (deep venous thrombosis) (HCC) 10/08/2018    PCP: [facility]  REFERRING PROVIDER: Bucio, Elsa C, FNP  REFERRING DIAG: lymphedema  THERAPY DIAG:  lymphedema  Rationale for Evaluation and Treatment: Rehabilitation  ONSET DATE: Since she was 14 primary lymphedema  SUBJECTIVE:                                                                                                                                                                                           SUBJECTIVE STATEMENT: PT states that the bandages stayed on, however, she had to take the thigh bandages off her Lt Leg due to not being able to bend her knee.  PERTINENT HISTORY:  morbid obesity, HTN, anemia, remote hx of DVT  PAIN:  Are you having pain? Yes .  NPRS  scale: 6/10 Pain location: medial ankle  PRECAUTIONS: Other: cellulitis    WEIGHT BEARING RESTRICTIONS: No  FALLS:  Has patient fallen in last 6 months? No  LIVING ENVIRONMENT: Lives with: lives alone Lives in: House/apartment Has following equipment at home: Single point cane  OCCUPATION: not employed   PATIENT GOALS:  I want my legs to get smaller and to walk better.     OBJECTIVE: Note:  Objective measures were completed at Evaluation unless otherwise noted.  COGNITION: Overall cognitive status: Within functional limits for tasks assessed   PALPATION: Noted induration   OBSERVATIONS / OTHER ASSESSMENTS: noted    LYMPHEDEMA ASSESSMENTS:     LE LANDMARK RIGHT-sitting Eval6/4/25 04/15/24 04/23/24 04/29/24 05/06/24 Right sitting 05/13/24 05/19/24 05/27/24  At groin          30 cm proximal to suprapatella          20 cm proximal to suprapatella 106.5 105 106.5  103.3 103 100 102 102.2  10 cm proximal to suprapatella 104.5 104.3 106.2 102.4 102 97 99 99  At midpatella / popliteal crease 61.5 under lobe 59 60 59 59 58 60 58  30 cm proximal to floor at lateral plantar foot 87 84.5 69 71 69 81 70 68  20 cm proximal to floor at lateral plantar foot 75 75 84 79.8 79.9 68 79 77  10 cm proximal to floor at lateral plantar foot 64.9 63.2 65.8 65 63 61 61.8 62  Circumference of ankle/heel 41.5 42 40 39.5 40 10 41 42  5 cm proximal to 1st MTP joint 33 31.5 32 31.9 31 31  31.7 31  Across MTP joint 28.8 31 29  28.5 26.5 29.5 28.5 27.8  Around proximal great toe          (Blank rows = not tested)  LE [facility] LEFT Eval6/4/25 04/15/24 04/23/24 04/29/24 05/06/24 Left sitting 05/13/24 05/19/24 05/27/24  At groin          30 cm proximal to suprapatella          20 cm proximal to suprapatella 147 140 144 143 142 138 136 138  10 cm proximal to suprapatella 129.8 129.5 131 129.8 130 128 125 128  At midpatella / popliteal crease 79 79 75 68 68 68 67 70  30 cm proximal to floor at lateral plantar foot  73.5 58 68 69 70 70 71 69  20 cm proximal to floor at lateral plantar foot 78.5 71 74 70 69.5 68 68 69  10 cm proximal to floor at lateral plantar foot 75.8 62 64 64 62 62 61.5 60  Circumference of ankle/heel 42 41.2 42 41 40.5 40 38 42  5 cm proximal to 1st MTP joint 32.8 32.2 32.2 31.6 31.5 31 31.5 32  Across MTP joint 27.8 28.3 28.7 28.8 27 28 27 28   Around proximal great toe          (Blank rows = not tested)  FUNCTIONAL TESTS: 04/08/24  30 second sit to stand: 4 1:30 minute walk test: 76 feet.  Pt uses a cane and had to rest once.      TODAY'S TREATMENT:     05/29/24:  Decongestive manual techniques including supraclavicular, deep and superficial abdominal, inguinal to axillary anastomosis followed by B LE.   Compression bandaging from toes to thigh B using multilayer short stretch bandaging and 1/2 foam .    Thigh's were bandaged with pt standing using figure 8 motion to attempt to cover lobules. Therapist avoided bandaging the back the Lt knee in hopes of keeping Lt thigh bandages on longer.  PATIENT EDUCATION:  04/29/24:  Cervical ROM, cervical retraction, shoulder rolls, trunk Side bend.  04/10/24:  The benefits of completing self manual specifically working on moving fluid from inner to outer thigh . Education details: four aspects to treating lymphedema to include 1)Skin care, 2)exercise, 3)manual, 4) compression .   Person educated: Patient Education method:  Explanation, Verbal cues, and Handouts Education comprehension: verbalized understanding  HOME EXERCISE PROGRAM: 04/29/24: Cervical ROM, cervical retraction, shoulder rolls, trunk Side bend.  04/10/24:   Ankle pumps, Long arc quad, hip ab/adduction, marching in place, diaphragm breathing.    ASSESSMENT:  CLINICAL IMPRESSION:    Noted decreased induration of both thigh areas.    PT able to stay standing for bandaging of RT LE today.  Bandaging Lt thigh  continues to take two people, therapist did not bandage the back of pt Lt  knee in hopes of keeping bandages on longer.  Therapist instructed pt to keep the sleeve on even if she has to remove the bandages from the Lt thigh.    Pt will continue to need skilled PT services for total decongestive techniques.    OBJECTIVE IMPAIRMENTS: decreased activity tolerance, decreased mobility, difficulty walking, increased edema, and obesity.   ACTIVITY LIMITATIONS: bathing, toileting, dressing, hygiene/grooming, and locomotion level  PARTICIPATION LIMITATIONS: cleaning, shopping, community activity, and occupation  PERSONAL FACTORS: Fitness, Time since onset of injury/illness/exacerbation, and 1 comorbidity: obesity are also affecting patient's functional outcome.   REHAB POTENTIAL: Good  CLINICAL DECISION MAKING: Evolving/moderate complexity  EVALUATION COMPLEXITY: Moderate  GOALS: Goals reviewed with patient? No  SHORT TERM GOALS: Target date: 05/06/24  PT to be I in skin care to decrease the risk of cellulitis Baseline: Goal status: met  2.  Pt to be I in HEP in order to improve lymphatic circulation Baseline:  Goal status: met  3.  Pt skin integrity to have improved noted by skin no longer flaking off Baseline:  Goal status: met  4.  PT to have lost 4 cm from LE measurement to decrease risk of cellulitis  Baseline:  Goal status:met    LONG TERM GOALS: Target date: 06/03/24  Pt to be I in donning a trim to fit compression garment Baseline:  Goal status: on-going  2.  PT to be using her pump Baseline:  Goal status:on-going  3.  PT to have lost 10 cm or more from LE measurement to allow improved mobilityu Baseline:  Goal status: on-going  4.  Pt to be able to complete 8 sit to stand in 30 minutes.   Baseline:  Goal status: on-going   PLAN:  PT FREQUENCY: 3x/week  PT DURATION: 6 weeks  PLANNED INTERVENTIONS: 97110-Therapeutic exercises, 97535- Self Care, and 02859- Manual therapy  PLAN FOR NEXT SESSION: [facility] compression to come to Monday's  treatment to try and fit pt for a cut to fit garment.  Continue with total decongestive therapy Montie Metro, PT CLT  [phone]  [facility]     05/29/2024, 12:21 PM

## 2024-06-01 ENCOUNTER — Ambulatory Visit (HOSPITAL_COMMUNITY): Payer: Self-pay | Admitting: Physical Therapy

## 2024-06-01 DIAGNOSIS — I890 Lymphedema, not elsewhere classified: Secondary | ICD-10-CM | POA: Diagnosis not present

## 2024-06-01 NOTE — Therapy (Signed)
 OUTPATIENT PHYSICAL THERAPY LYMPHEDEMA Treatment   Patient Name: Victoria Cummings MRN: [id number] DOB:1966-02-04, 58 y.o., female Today's Date: 06/01/2024    END OF SESSION:  PT End of Session - 06/01/24 1456     Visit Number 20    Number of Visits 24    Date for PT Re-Evaluation 06/03/24    Authorization Type medicare    Progress Note Due on Visit 20    PT Start Time 1310    PT Stop Time 1426    PT Time Calculation (min) 76 min    Activity Tolerance Patient tolerated treatment well    Behavior During Therapy WFL for tasks assessed/performed                 Past Medical History:  Diagnosis Date   Anemia    Bronchitis    DVT (deep venous thrombosis) (HCC)    Hyperlipidemia    Hypertension    Lymph edema    Morbid obesity (HCC)    Pneumonia    No past surgical history on file. Patient Active Problem List   Diagnosis Date Noted   Iron deficiency anemia due to chronic blood loss 04/22/2019   History of heavy vaginal bleeding 04/14/2019   PMB (postmenopausal bleeding) 04/14/2019   Encounter for gynecological examination with Papanicolaou smear of cervix 04/14/2019   Acute pulmonary embolism (HCC)    Anemia    DVT (deep venous thrombosis) (HCC) 10/08/2018    PCP: [facility]  REFERRING PROVIDER: Bucio, Elsa C, FNP  REFERRING DIAG: lymphedema  THERAPY DIAG:  lymphedema  Rationale for Evaluation and Treatment: Rehabilitation  ONSET DATE: Since she was 14 primary lymphedema  SUBJECTIVE:                                                                                                                                                                                           SUBJECTIVE STATEMENT: PT states she is well today overall.  No issues.  PERTINENT HISTORY:  morbid obesity, HTN, anemia, remote hx of DVT  PAIN:  Are you having pain? Yes .  NPRS scale: 6/10 Pain location: medial ankle  PRECAUTIONS: Other: cellulitis    WEIGHT  BEARING RESTRICTIONS: No  FALLS:  Has patient fallen in last 6 months? No  LIVING ENVIRONMENT: Lives with: lives alone Lives in: House/apartment Has following equipment at home: Single point cane  OCCUPATION: not employed   PATIENT GOALS:  I want my legs to get smaller and to walk better.     OBJECTIVE: Note: Objective measures were completed at Evaluation unless otherwise noted.  COGNITION: Overall cognitive status: Within functional limits for  tasks assessed   PALPATION: Noted induration   OBSERVATIONS / OTHER ASSESSMENTS: noted    LYMPHEDEMA ASSESSMENTS:     LE LANDMARK RIGHT-sitting Eval6/4/25 04/15/24 04/23/24 04/29/24 05/06/24 Right sitting 05/13/24 05/19/24 05/27/24  At groin          30 cm proximal to suprapatella          20 cm proximal to suprapatella 106.5 105 106.5  103.3 103 100 102 102.2  10 cm proximal to suprapatella 104.5 104.3 106.2 102.4 102 97 99 99  At midpatella / popliteal crease 61.5 under lobe 59 60 59 59 58 60 58  30 cm proximal to floor at lateral plantar foot 87 84.5 69 71 69 81 70 68  20 cm proximal to floor at lateral plantar foot 75 75 84 79.8 79.9 68 79 77  10 cm proximal to floor at lateral plantar foot 64.9 63.2 65.8 65 63 61 61.8 62  Circumference of ankle/heel 41.5 42 40 39.5 40 10 41 42  5 cm proximal to 1st MTP joint 33 31.5 32 31.9 31 31  31.7 31  Across MTP joint 28.8 31 29  28.5 26.5 29.5 28.5 27.8  Around proximal great toe          (Blank rows = not tested)  LE [facility] LEFT Eval6/4/25 04/15/24 04/23/24 04/29/24 05/06/24 Left sitting 05/13/24 05/19/24 05/27/24  At groin          30 cm proximal to suprapatella          20 cm proximal to suprapatella 147 140 144 143 142 138 136 138  10 cm proximal to suprapatella 129.8 129.5 131 129.8 130 128 125 128  At midpatella / popliteal crease 79 79 75 68 68 68 67 70  30 cm proximal to floor at lateral plantar foot 73.5 58 68 69 70 70 71 69  20 cm proximal to floor at lateral plantar foot 78.5 71  74 70 69.5 68 68 69  10 cm proximal to floor at lateral plantar foot 75.8 62 64 64 62 62 61.5 60  Circumference of ankle/heel 42 41.2 42 41 40.5 40 38 42  5 cm proximal to 1st MTP joint 32.8 32.2 32.2 31.6 31.5 31 31.5 32  Across MTP joint 27.8 28.3 28.7 28.8 27 28 27 28   Around proximal great toe          (Blank rows = not tested)  FUNCTIONAL TESTS: 04/08/24  30 second sit to stand: 4 1:30 minute walk test: 76 feet.  Pt uses a cane and had to rest once.      TODAY'S TREATMENT:    06/01/24 05/29/24:   Fitting of cut to fit garments, [employer] rep present for session Education/sequencing/labeling of garments Care of garments  PATIENT EDUCATION:  04/29/24:  Cervical ROM, cervical retraction, shoulder rolls, trunk Side bend.  04/10/24:  The benefits of completing self manual specifically working on moving fluid from inner to outer thigh . Education details: four aspects to treating lymphedema to include 1)Skin care, 2)exercise, 3)manual, 4) compression .   Person educated: Patient Education method: Explanation, Verbal cues, and Handouts Education comprehension: verbalized understanding  HOME EXERCISE PROGRAM: 04/29/24: Cervical ROM, cervical retraction, shoulder rolls, trunk Side bend.  04/10/24:   Ankle pumps, Long arc quad, hip ab/adduction, marching in place, diaphragm breathing.    ASSESSMENT:  CLINICAL IMPRESSION:    Noted decreased induration of both thigh areas.    PT able to stay standing for bandaging of RT LE today.  Bandaging Lt thigh  continues to take two people, therapist did not bandage the back of pt Lt knee in hopes of keeping bandages on longer.  Therapist instructed pt to keep the Giovanna and Sam here today from [facility] compression.  Rep fitted/adjusted sleeves with education provided on care/were.  Therapist reiterated this and reviewed all donning/doffing and care of garments today.  Therapist also removed and labeled each piece and made adjustments to Lt lower garment as did not  fit properly. Pt knows to adjust bandaging and keep on limbs as much as possible.  Pt given all extra velcro/supplies.  Bandaging did not slip while walking out and pt reported overall comfort.  No further questions today.  Will resume manual next session.  Pt will continue to need skilled PT services for total decongestive techniques.     OBJECTIVE IMPAIRMENTS: decreased activity tolerance, decreased mobility, difficulty walking, increased edema, and obesity.   ACTIVITY LIMITATIONS: bathing, toileting, dressing, hygiene/grooming, and locomotion level  PARTICIPATION LIMITATIONS: cleaning, shopping, community activity, and occupation  PERSONAL FACTORS: Fitness, Time since onset of injury/illness/exacerbation, and 1 comorbidity: obesity are also affecting patient's functional outcome.   REHAB POTENTIAL: Good  CLINICAL DECISION MAKING: Evolving/moderate complexity  EVALUATION COMPLEXITY: Moderate  GOALS: Goals reviewed with patient? No  SHORT TERM GOALS: Target date: 05/06/24  PT to be I in skin care to decrease the risk of cellulitis Baseline: Goal status: met  2.  Pt to be I in HEP in order to improve lymphatic circulation Baseline:  Goal status: met  3.  Pt skin integrity to have improved noted by skin no longer flaking off Baseline:  Goal status: met  4.  PT to have lost 4 cm from LE measurement to decrease risk of cellulitis  Baseline:  Goal status:met    LONG TERM GOALS: Target date: 06/03/24  Pt to be I in donning a trim to fit compression garment Baseline:  Goal status: on-going  2.  PT to be using her pump Baseline:  Goal status:on-going  3.  PT to have lost 10 cm or more from LE measurement to allow improved mobilityu Baseline:  Goal status: on-going  4.  Pt to be able to complete 8 sit to stand in 30 minutes.   Baseline:  Goal status: on-going   PLAN:  PT FREQUENCY: 3x/week  PT DURATION: 6 weeks  PLANNED INTERVENTIONS: 97110-Therapeutic exercises,  97535- Self Care, and 02859- Manual therapy  PLAN FOR NEXT SESSION:  Adjust garments as needed.  Continue with total decongestive therapy   Greig KATHEE Fuse, PTA/CLT [facility] Ph: [phone]  06/01/2024, 2:57 PM

## 2024-06-03 ENCOUNTER — Ambulatory Visit (HOSPITAL_COMMUNITY): Payer: Self-pay | Admitting: Physical Therapy

## 2024-06-03 DIAGNOSIS — I890 Lymphedema, not elsewhere classified: Secondary | ICD-10-CM

## 2024-06-03 NOTE — Therapy (Signed)
 OUTPATIENT PHYSICAL THERAPY LYMPHEDEMA Treatment   Patient Name: Victoria Cummings MRN: [id number] DOB:1965/12/10, 58 y.o., female Today's Date: 06/03/2024    END OF SESSION:  PT End of Session - 06/03/24 1643     Visit Number 21    Number of Visits 33    Date for PT Re-Evaluation 07/03/24    Authorization Type medicare    Progress Note Due on Visit 30    PT Start Time 1020    PT Stop Time 1210    PT Time Calculation (min) 110 min    Activity Tolerance Patient tolerated treatment well    Behavior During Therapy WFL for tasks assessed/performed                  Past Medical History:  Diagnosis Date   Anemia    Bronchitis    DVT (deep venous thrombosis) (HCC)    Hyperlipidemia    Hypertension    Lymph edema    Morbid obesity (HCC)    Pneumonia    No past surgical history on file. Patient Active Problem List   Diagnosis Date Noted   Iron deficiency anemia due to chronic blood loss 04/22/2019   History of heavy vaginal bleeding 04/14/2019   PMB (postmenopausal bleeding) 04/14/2019   Encounter for gynecological examination with Papanicolaou smear of cervix 04/14/2019   Acute pulmonary embolism (HCC)    Anemia    DVT (deep venous thrombosis) (HCC) 10/08/2018    PCP: [facility]  REFERRING PROVIDER: Bucio, Silvio BROCKS, FNP  REFERRING DIAG: lymphedema  THERAPY DIAG:  lymphedema  Rationale for Evaluation and Treatment: Rehabilitation  ONSET DATE: Since she was 14 primary lymphedema  SUBJECTIVE:                                                                                                                                                                                           SUBJECTIVE STATEMENT: PT states the trim to fit stayed on until yesterday.  States that she feels her feet are going to blow up if there is no compression on them.  States that the rep had compression for her feet but was fearful that she was going to fall so he took them  back.   PERTINENT HISTORY:  morbid obesity, HTN, anemia, remote hx of DVT  PAIN:  Are you having pain? Yes .  NPRS scale: 6/10 Pain location: medial ankle  PRECAUTIONS: Other: cellulitis    WEIGHT BEARING RESTRICTIONS: No  FALLS:  Has patient fallen in last 6 months? No  LIVING ENVIRONMENT: Lives with: lives alone Lives in: House/apartment Has following equipment at home: Single point  cane  OCCUPATION: not employed   PATIENT GOALS:  I want my legs to get smaller and to walk better.     OBJECTIVE: Note: Objective measures were completed at Evaluation unless otherwise noted.  COGNITION: Overall cognitive status: Within functional limits for tasks assessed   PALPATION: Noted induration   OBSERVATIONS / OTHER ASSESSMENTS: noted    LYMPHEDEMA ASSESSMENTS:     LE L[place]MARK RIGHT-sitting Eval6/4/25 04/15/24 04/23/24 04/29/24 05/06/24 Right sitting 05/13/24 05/19/24 05/27/24 06/03/24  At groin           30 cm proximal to suprapatella           20 cm proximal to suprapatella 106.5 105 106.5  103.3 103 100 102 102.2 102.5  10 cm proximal to suprapatella 104.5 104.3 106.2 102.4 102 97 99 99 99.2  At midpatella / popliteal crease 61.5 under lobe 59 60 59 59 58 60 58 59  30 cm proximal to floor at lateral plantar foot 87 84.5 69 71 69 81 70 68 67  20 cm proximal to floor at lateral plantar foot 75 75 84 79.8 79.9 68 79 77 79  10 cm proximal to floor at lateral plantar foot 64.9 63.2 65.8 65 63 61 61.8 62 64  Circumference of ankle/heel 41.5 42 40 39.5 40 10 41 42 42.5  5 cm proximal to 1st MTP joint 33 31.5 32 31.9 31 31  31.7 31 32  Across MTP joint 28.8 31 29  28.5 26.5 29.5 28.5 27.8 28.5  Around proximal great toe           (Blank rows = not tested)  LE [facility] LEFT Eval6/4/25 04/15/24 04/23/24 04/29/24 05/06/24 Left sitting 05/13/24 05/19/24 05/27/24 06/03/24  At groin           30 cm proximal to suprapatella           20 cm proximal to suprapatella 147 140 144 143 142 138 136 138  139  10 cm proximal to suprapatella 129.8 129.5 131 129.8 130 128 125 128 130  At midpatella / popliteal crease 79 79 75 68 68 68 67 70 75  30 cm proximal to floor at lateral plantar foot 73.5 58 68 69 70 70 71 69 71.5  20 cm proximal to floor at lateral plantar foot 78.5 71 74 70 69.5 68 68 69 66  10 cm proximal to floor at lateral plantar foot 75.8 62 64 64 62 62 61.5 60 61  Circumference of ankle/heel 42 41.2 42 41 40.5 40 38 42 43.7  5 cm proximal to 1st MTP joint 32.8 32.2 32.2 31.6 31.5 31 31.5 32 33  Across MTP joint 27.8 28.3 28.7 28.8 27 28 27 28 29   Around proximal great toe           (Blank rows = not tested)  FUNCTIONAL TESTS: 04/08/24  30 second sit to stand: 4 1:30 minute walk test: 76 feet.  Pt uses a cane and had to rest once.   06/03/24:   30 second sit to stand x 5 1:30 minute walk test:  90 ft using cane.   TODAY'S TREATMENT:    06/03/24 measurement:  Manual to include supraclavicular, deep and superficial abdominal, inguinal to axillary anastomosis followed by B LE.  PT then donned and educated pt on donning trim to fit compression garments.  Therapist used extra strap to use compression on the foot.  PATIENT EDUCATION:  04/29/24:  Cervical ROM, cervical retraction, shoulder rolls, trunk Side bend.  04/10/24:  The benefits of completing self manual specifically working on moving fluid from inner to outer thigh . Education details: four aspects to treating lymphedema to include 1)Skin care, 2)exercise, 3)manual, 4) compression .   Person educated: Patient Education method: Explanation, Verbal cues, and Handouts Education comprehension: verbalized understanding  HOME EXERCISE PROGRAM: 04/29/24: Cervical ROM, cervical retraction, shoulder rolls, trunk Side bend.  04/10/24:   Ankle pumps, Long arc quad, hip ab/adduction, marching in place, diaphragm breathing.    ASSESSMENT:  CLINICAL IMPRESSION:    Pt has slight improvement in functional ability since initial evaluation.   PT has lost an average of 4-5 cm on Rt thigh and leg; with an average of 6 on Lt LE. Pt received trim to fit yesterday in hopes of being able to keep compression donned longer for improved loss of fluid to continue to improve functional ability.  Pt notes improved ability to straighten leg.      OBJECTIVE IMPAIRMENTS: decreased activity tolerance, decreased mobility, difficulty walking, increased edema, and obesity.   ACTIVITY LIMITATIONS: bathing, toileting, dressing, hygiene/grooming, and locomotion level  PARTICIPATION LIMITATIONS: cleaning, shopping, community activity, and occupation  PERSONAL FACTORS: Fitness, Time since onset of injury/illness/exacerbation, and 1 comorbidity: obesity are also affecting patient's functional outcome.   REHAB POTENTIAL: Good  CLINICAL DECISION MAKING: Evolving/moderate complexity  EVALUATION COMPLEXITY: Moderate  GOALS: Goals reviewed with patient? No  SHORT TERM GOALS: Target date: 05/06/24  PT to be I in skin care to decrease the risk of cellulitis Baseline: Goal status: met  2.  Pt to be I in HEP in order to improve lymphatic circulation Baseline:  Goal status: met  3.  Pt skin integrity to have improved noted by skin no longer flaking off Baseline:  Goal status: met  4.  PT to have lost 4 cm from LE measurement to decrease risk of cellulitis  Baseline:  Goal status:met    LONG TERM GOALS: Target date: 06/03/24  Pt to be I in donning a trim to fit compression garment Baseline: pt did not have a trim to fit garment Goal status: on-going  2.  PT to be using her pump Baseline:  Goal status:met  3.  PT to have lost 10 cm or more from LE measurement to allow improved mobilityu Baseline:  Goal status: on-going  4.  Pt to be able to complete 8 sit to stand in 30 minutes.   Baseline:  Goal status: on-going   PLAN:  PT FREQUENCY: 3x/week  PT DURATION: 6 weeks continue to 5 more weeks   PLANNED INTERVENTIONS: 97110-Therapeutic  exercises, 97535- Self Care, and 02859- Manual therapy  PLAN FOR NEXT SESSION:  Adjust garments as needed.  Continue with total decongestive therapy  Montie Metro, PT CLT [phone]  06/03/2024, 4:46 PM

## 2024-06-05 ENCOUNTER — Ambulatory Visit (HOSPITAL_COMMUNITY): Payer: Self-pay | Attending: Family Medicine | Admitting: Physical Therapy

## 2024-06-05 DIAGNOSIS — I890 Lymphedema, not elsewhere classified: Secondary | ICD-10-CM | POA: Diagnosis present

## 2024-06-05 NOTE — Therapy (Signed)
 OUTPATIENT PHYSICAL THERAPY LYMPHEDEMA Treatment   Patient Name: Victoria Cummings MRN: [id number] DOB:November 19, 1965, 58 y.o., female Today's Date: 06/05/2024    END OF SESSION:  PT End of Session - 06/05/24 1223     Visit Number 22    Number of Visits 33    Date for PT Re-Evaluation 07/03/24    Authorization Type medicare    Progress Note Due on Visit 30    PT Start Time 1020    PT Stop Time 1148    PT Time Calculation (min) 88 min    Activity Tolerance Patient tolerated treatment well    Behavior During Therapy WFL for tasks assessed/performed                  Past Medical History:  Diagnosis Date   Anemia    Bronchitis    DVT (deep venous thrombosis) (HCC)    Hyperlipidemia    Hypertension    Lymph edema    Morbid obesity (HCC)    Pneumonia    No past surgical history on file. Patient Active Problem List   Diagnosis Date Noted   Iron deficiency anemia due to chronic blood loss 04/22/2019   History of heavy vaginal bleeding 04/14/2019   PMB (postmenopausal bleeding) 04/14/2019   Encounter for gynecological examination with Papanicolaou smear of cervix 04/14/2019   Acute pulmonary embolism (HCC)    Anemia    DVT (deep venous thrombosis) (HCC) 10/08/2018    PCP: [facility]  REFERRING PROVIDER: Bucio, Silvio BROCKS, FNP  REFERRING DIAG: lymphedema  THERAPY DIAG:  lymphedema  Rationale for Evaluation and Treatment: Rehabilitation  ONSET DATE: Since she was 14 primary lymphedema  SUBJECTIVE:                                                                                                                                                                                           SUBJECTIVE STATEMENT: Pt states she could tell that it was easier to don her underwear this morning.   PERTINENT HISTORY:  morbid obesity, HTN, anemia, remote hx of DVT  PAIN:  Are you having pain? Yes .  NPRS scale: 6/10 Pain location: medial ankle  PRECAUTIONS:  Other: cellulitis    WEIGHT BEARING RESTRICTIONS: No  FALLS:  Has patient fallen in last 6 months? No  LIVING ENVIRONMENT: Lives with: lives alone Lives in: House/apartment Has following equipment at home: Single point cane  OCCUPATION: not employed   PATIENT GOALS:  I want my legs to get smaller and to walk better.     OBJECTIVE: Note: Objective measures were completed at Evaluation unless otherwise noted.  COGNITION:  Overall cognitive status: Within functional limits for tasks assessed   PALPATION: Noted induration   OBSERVATIONS / OTHER ASSESSMENTS: noted    LYMPHEDEMA ASSESSMENTS:     LE L[place]MARK RIGHT-sitting Eval6/4/25 04/15/24 04/23/24 04/29/24 05/06/24 Right sitting 05/13/24 05/19/24 05/27/24 06/03/24  At groin           30 cm proximal to suprapatella           20 cm proximal to suprapatella 106.5 105 106.5  103.3 103 100 102 102.2 102.5  10 cm proximal to suprapatella 104.5 104.3 106.2 102.4 102 97 99 99 99.2  At midpatella / popliteal crease 61.5 under lobe 59 60 59 59 58 60 58 59  30 cm proximal to floor at lateral plantar foot 87 84.5 69 71 69 81 70 68 67  20 cm proximal to floor at lateral plantar foot 75 75 84 79.8 79.9 68 79 77 79  10 cm proximal to floor at lateral plantar foot 64.9 63.2 65.8 65 63 61 61.8 62 64  Circumference of ankle/heel 41.5 42 40 39.5 40 10 41 42 42.5  5 cm proximal to 1st MTP joint 33 31.5 32 31.9 31 31  31.7 31 32  Across MTP joint 28.8 31 29  28.5 26.5 29.5 28.5 27.8 28.5  Around proximal great toe           (Blank rows = not tested)  LE [facility] LEFT Eval6/4/25 04/15/24 04/23/24 04/29/24 05/06/24 Left sitting 05/13/24 05/19/24 05/27/24 06/03/24  At groin           30 cm proximal to suprapatella           20 cm proximal to suprapatella 147 140 144 143 142 138 136 138 139  10 cm proximal to suprapatella 129.8 129.5 131 129.8 130 128 125 128 130  At midpatella / popliteal crease 79 79 75 68 68 68 67 70 75  30 cm proximal to floor at lateral  plantar foot 73.5 58 68 69 70 70 71 69 71.5  20 cm proximal to floor at lateral plantar foot 78.5 71 74 70 69.5 68 68 69 66  10 cm proximal to floor at lateral plantar foot 75.8 62 64 64 62 62 61.5 60 61  Circumference of ankle/heel 42 41.2 42 41 40.5 40 38 42 43.7  5 cm proximal to 1st MTP joint 32.8 32.2 32.2 31.6 31.5 31 31.5 32 33  Across MTP joint 27.8 28.3 28.7 28.8 27 28 27 28 29   Around proximal great toe           (Blank rows = not tested)  FUNCTIONAL TESTS: 04/08/24  30 second sit to stand: 4 1:30 minute walk test: 76 feet.  Pt uses a cane and had to rest once.   06/03/24:   30 second sit to stand x 5 1:30 minute walk test:  90 ft using cane.   TODAY'S TREATMENT:    06/05/24: Manual to include supraclavicular, deep and superficial abdominal, inguinal to axillary anastomosis followed by B LE.  PT then donned and educated pt on donning trim to fit compression garments.  Therapist used a knee cap for foot compression.  Will call [employer] to obtain ankle foot compression. PATIENT EDUCATION:  04/29/24:  Cervical ROM, cervical retraction, shoulder rolls, trunk Side bend.  04/10/24:  The benefits of completing self manual specifically working on moving fluid from inner to outer thigh . Education details: four aspects to treating lymphedema to include 1)Skin care, 2)exercise, 3)manual, 4) compression .  Person educated: Patient Education method: Explanation, Verbal cues, and Handouts Education comprehension: verbalized understanding  HOME EXERCISE PROGRAM: 04/29/24: Cervical ROM, cervical retraction, shoulder rolls, trunk Side bend.  04/10/24:   Ankle pumps, Long arc quad, hip ab/adduction, marching in place, diaphragm breathing.    ASSESSMENT:  CLINICAL IMPRESSION:    PT with improved movement.  Pt continues to loose volume and will continue to benefit from total decongestive techniques.  Thigh trim to fits will most likely need to be trimmed next session.   OBJECTIVE IMPAIRMENTS:  decreased activity tolerance, decreased mobility, difficulty walking, increased edema, and obesity.   ACTIVITY LIMITATIONS: bathing, toileting, dressing, hygiene/grooming, and locomotion level  PARTICIPATION LIMITATIONS: cleaning, shopping, community activity, and occupation  PERSONAL FACTORS: Fitness, Time since onset of injury/illness/exacerbation, and 1 comorbidity: obesity are also affecting patient's functional outcome.   REHAB POTENTIAL: Good  CLINICAL DECISION MAKING: Evolving/moderate complexity  EVALUATION COMPLEXITY: Moderate  GOALS: Goals reviewed with patient? No  SHORT TERM GOALS: Target date: 05/06/24  PT to be I in skin care to decrease the risk of cellulitis Baseline: Goal status: met  2.  Pt to be I in HEP in order to improve lymphatic circulation Baseline:  Goal status: met  3.  Pt skin integrity to have improved noted by skin no longer flaking off Baseline:  Goal status: met  4.  PT to have lost 4 cm from LE measurement to decrease risk of cellulitis  Baseline:  Goal status:met    LONG TERM GOALS: Target date: 06/03/24  Pt to be I in donning a trim to fit compression garment Baseline: pt did not have a trim to fit garment Goal status: on-going  2.  PT to be using her pump Baseline:  Goal status:met  3.  PT to have lost 10 cm or more from LE measurement to allow improved mobilityu Baseline:  Goal status: on-going  4.  Pt to be able to complete 8 sit to stand in 30 minutes.   Baseline:  Goal status: on-going   PLAN:  PT FREQUENCY: 3x/week  PT DURATION: 6 weeks continue to 5 more weeks   PLANNED INTERVENTIONS: 97110-Therapeutic exercises, 97535- Self Care, and 02859- Manual therapy  PLAN FOR NEXT SESSION:  Adjust garments as needed.  Continue with total decongestive therapy  Montie Metro, PT CLT [phone]  06/05/2024, 12:24 PM

## 2024-06-09 ENCOUNTER — Encounter (HOSPITAL_COMMUNITY): Admitting: Physical Therapy

## 2024-06-11 ENCOUNTER — Ambulatory Visit (HOSPITAL_COMMUNITY): Admitting: Physical Therapy

## 2024-06-11 DIAGNOSIS — I890 Lymphedema, not elsewhere classified: Secondary | ICD-10-CM

## 2024-06-11 NOTE — Therapy (Signed)
 OUTPATIENT PHYSICAL THERAPY LYMPHEDEMA Treatment   Patient Name: Victoria Cummings MRN: [id number] DOB:[birth year], 58 y.o., female Today's Date: 06/11/2024    END OF SESSION:  PT End of Session - 06/11/24 1540     Visit Number 23    Number of Visits 33    Date for PT Re-Evaluation 07/03/24    Authorization Type medicare    Progress Note Due on Visit 30    PT Start Time 1350    PT Stop Time 1530    PT Time Calculation (min) 100 min    Activity Tolerance Patient tolerated treatment well    Behavior During Therapy WFL for tasks assessed/performed                   Past Medical History:  Diagnosis Date   Anemia    Bronchitis    DVT (deep venous thrombosis) (HCC)    Hyperlipidemia    Hypertension    Lymph edema    Morbid obesity (HCC)    Pneumonia    No past surgical history on file. Patient Active Problem List   Diagnosis Date Noted   Iron deficiency anemia due to chronic blood loss 04/22/2019   History of heavy vaginal bleeding 04/14/2019   PMB (postmenopausal bleeding) 04/14/2019   Encounter for gynecological examination with Papanicolaou smear of cervix 04/14/2019   Acute pulmonary embolism (HCC)    Anemia    DVT (deep venous thrombosis) (HCC) 10/08/2018    PCP: [facility]  REFERRING PROVIDER: Bucio, Silvio BROCKS, FNP  REFERRING DIAG: lymphedema  THERAPY DIAG:  lymphedema  Rationale for Evaluation and Treatment: Rehabilitation  ONSET DATE: Since she was 14 primary lymphedema  SUBJECTIVE:                                                                                                                                                                                           SUBJECTIVE STATEMENT: Pt states she had to take the compression wrap off on Sunday due to her feet swelling so severely.  She was unable to come to treatment Monday due to having MD appointments and unable to come to treatment on Wed. Due to department not having any  appointments.   She is unable to I don the cut to fit yet; therefore she has been without compression for four days.   She has to have something for her feet as they blow up.   PERTINENT HISTORY:  morbid obesity, HTN, anemia, remote hx of DVT  PAIN:  Are you having pain? Yes .  NPRS scale: 6/10 Pain location: medial ankle  PRECAUTIONS: Other: cellulitis  WEIGHT BEARING RESTRICTIONS: No  FALLS:  Has patient fallen in last 6 months? No  LIVING ENVIRONMENT: Lives with: lives alone Lives in: House/apartment Has following equipment at home: Single point cane  OCCUPATION: not employed   PATIENT GOALS:  I want my legs to get smaller and to walk better.     OBJECTIVE: Note: Objective measures were completed at Evaluation unless otherwise noted.  COGNITION: Overall cognitive status: Within functional limits for tasks assessed   PALPATION: Noted induration   OBSERVATIONS / OTHER ASSESSMENTS: noted    LYMPHEDEMA ASSESSMENTS:     LE LANDMARK RIGHT-sitting Eval6/4/25 04/15/24 04/23/24 04/29/24 05/06/24 Right sitting 05/13/24 05/19/24 05/27/24 06/03/24 06/11/24   At groin             30 cm proximal to suprapatella             20 cm proximal to suprapatella 106.5 105 106.5  103.3 103 100 102 102.2 102.5 123   10 cm proximal to suprapatella 104.5 104.3 106.2 102.4 102 97 99 99 99.2 120   At midpatella / popliteal crease 61.5 under lobe 59 60 59 59 58 60 58 59 65   30 cm proximal to floor at lateral plantar foot 87 84.5 69 71 69 81 70 68 67 73   20 cm proximal to floor at lateral plantar foot 75 75 84 79.8 79.9 68 79 77 79 68.5   10 cm proximal to floor at lateral plantar foot 64.9 63.2 65.8 65 63 61 61.8 62 64 61.5   Circumference of ankle/heel 41.5 42 40 39.5 40 10 41 42 42.5 41   5 cm proximal to 1st MTP joint 33 31.5 32 31.9 31 31  31.7 31 32 31   Across MTP joint 28.8 31 29  28.5 26.5 29.5 28.5 27.8 28.5 28   Around proximal great toe             (Blank rows = not tested)  LE  [facility] LEFT Eval6/4/25 04/15/24 04/23/24 04/29/24 05/06/24 Left sitting 05/13/24 05/19/24 05/27/24 06/03/24 06/11/24  At groin            30 cm proximal to suprapatella            20 cm proximal to suprapatella 147 140 144 143 142 138 136 138 139   10 cm proximal to suprapatella 129.8 129.5 131 129.8 130 128 125 128 130   At midpatella / popliteal crease 79 79 75 68 68 68 67 70 75   30 cm proximal to floor at lateral plantar foot 73.5 58 68 69 70 70 71 69 71.5   20 cm proximal to floor at lateral plantar foot 78.5 71 74 70 69.5 68 68 69 66   10 cm proximal to floor at lateral plantar foot 75.8 62 64 64 62 62 61.5 60 61   Circumference of ankle/heel 42 41.2 42 41 40.5 40 38 42 43.7   5 cm proximal to 1st MTP joint 32.8 32.2 32.2 31.6 31.5 31 31.5 32 33   Across MTP joint 27.8 28.3 28.7 28.8 27 28 27 28 29    Around proximal great toe            (Blank rows = not tested)  FUNCTIONAL TESTS: 04/08/24  30 second sit to stand: 4 1:30 minute walk test: 76 feet.  Pt uses a cane and had to rest once.   06/03/24:   30 second sit to stand x 5 1:30 minute walk test:  90 ft using cane.   TODAY'S TREATMENT: measured:  Lt thigh significantly up all other aspects down    06/11/24: Manual to include supraclavicular, deep and superficial abdominal, inguinal to axillary anastomosis followed by B LE. Posterior completed in side lying position.  PT then donned and educated pt on donning trim to fit compression garments.  Therapist used a strap for foot compression.   [facility] was called to obtain ankle foot compression. PATIENT EDUCATION:  04/29/24:  Cervical ROM, cervical retraction, shoulder rolls, trunk Side bend.  04/10/24:  The benefits of completing self manual specifically working on moving fluid from inner to outer thigh . Education details: four aspects to treating lymphedema to include 1)Skin care, 2)exercise, 3)manual, 4) compression .   Person educated: Patient Education method: Explanation, Verbal cues, and  Handouts Education comprehension: verbalized understanding  HOME EXERCISE PROGRAM: 04/29/24: Cervical ROM, cervical retraction, shoulder rolls, trunk Side bend.  04/10/24:   Ankle pumps, Long arc quad, hip ab/adduction, marching in place, diaphragm breathing.    ASSESSMENT:  CLINICAL IMPRESSION:    PT with improved movement.  Pt continues to loose volume and will continue to benefit from total decongestive techniques.  Thigh trim to fits will most likely need to be trimmed next session.   OBJECTIVE IMPAIRMENTS: decreased activity tolerance, decreased mobility, difficulty walking, increased edema, and obesity.   ACTIVITY LIMITATIONS: bathing, toileting, dressing, hygiene/grooming, and locomotion level  PARTICIPATION LIMITATIONS: cleaning, shopping, community activity, and occupation  PERSONAL FACTORS: Fitness, Time since onset of injury/illness/exacerbation, and 1 comorbidity: obesity are also affecting patient's functional outcome.   REHAB POTENTIAL: Good  CLINICAL DECISION MAKING: Evolving/moderate complexity  EVALUATION COMPLEXITY: Moderate  GOALS: Goals reviewed with patient? No  SHORT TERM GOALS: Target date: 05/06/24  PT to be I in skin care to decrease the risk of cellulitis Baseline: Goal status: met  2.  Pt to be I in HEP in order to improve lymphatic circulation Baseline:  Goal status: met  3.  Pt skin integrity to have improved noted by skin no longer flaking off Baseline:  Goal status: met  4.  PT to have lost 4 cm from LE measurement to decrease risk of cellulitis  Baseline:  Goal status:met    LONG TERM GOALS: Target date: 06/03/24  Pt to be I in donning a trim to fit compression garment Baseline: pt did not have a trim to fit garment Goal status: on-going  2.  PT to be using her pump Baseline:  Goal status:met  3.  PT to have lost 10 cm or more from LE measurement to allow improved mobilityu Baseline:  Goal status: on-going  4.  Pt to be able to  complete 8 sit to stand in 30 minutes.   Baseline:  Goal status: on-going   PLAN:  PT FREQUENCY: 3x/week  PT DURATION: 6 weeks continue to 5 more weeks   PLANNED INTERVENTIONS: 97110-Therapeutic exercises, 97535- Self Care, and 02859- Manual therapy  PLAN FOR NEXT SESSION:  Adjust garments as needed.  Continue with total decongestive therapy  Montie Metro, PT CLT [phone]  06/11/2024, 3:40 PM

## 2024-06-15 ENCOUNTER — Encounter (HOSPITAL_COMMUNITY): Admitting: Physical Therapy

## 2024-06-17 ENCOUNTER — Ambulatory Visit (HOSPITAL_COMMUNITY): Admitting: Physical Therapy

## 2024-06-17 DIAGNOSIS — I890 Lymphedema, not elsewhere classified: Secondary | ICD-10-CM | POA: Diagnosis not present

## 2024-06-17 NOTE — Therapy (Signed)
 OUTPATIENT PHYSICAL THERAPY LYMPHEDEMA Treatment   Patient Name: Victoria Cummings MRN: [id number] DOB:1966/07/24, 58 y.o., female Today's Date: 06/17/2024    END OF SESSION:  PT End of Session - 06/17/24 1104     Visit Number 24    Number of Visits 33    Date for PT Re-Evaluation 07/03/24    Authorization Type medicare    Progress Note Due on Visit 30    PT Start Time 0901    PT Stop Time 1030    PT Time Calculation (min) 89 min    Activity Tolerance Patient tolerated treatment well    Behavior During Therapy WFL for tasks assessed/performed                    Past Medical History:  Diagnosis Date   Anemia    Bronchitis    DVT (deep venous thrombosis) (HCC)    Hyperlipidemia    Hypertension    Lymph edema    Morbid obesity (HCC)    Pneumonia    No past surgical history on file. Patient Active Problem List   Diagnosis Date Noted   Iron deficiency anemia due to chronic blood loss 04/22/2019   History of heavy vaginal bleeding 04/14/2019   PMB (postmenopausal bleeding) 04/14/2019   Encounter for gynecological examination with Papanicolaou smear of cervix 04/14/2019   Acute pulmonary embolism (HCC)    Anemia    DVT (deep venous thrombosis) (HCC) 10/08/2018    PCP: [facility]  REFERRING PROVIDER: Bucio, Silvio BROCKS, FNP  REFERRING DIAG: lymphedema  THERAPY DIAG:  lymphedema  Rationale for Evaluation and Treatment: Rehabilitation  ONSET DATE: Since she was 14 primary lymphedema  SUBJECTIVE:                                                                                                                                                                                           SUBJECTIVE STATEMENT: Pt states that she was not here Monday due to her transportation not picking her up.  PERTINENT HISTORY:  morbid obesity, HTN, anemia, remote hx of DVT  PAIN:  Are you having pain? Yes .  NPRS scale: 6/10 Pain location: medial ankle   PRECAUTIONS: Other: cellulitis    WEIGHT BEARING RESTRICTIONS: No  FALLS:  Has patient fallen in last 6 months? No  LIVING ENVIRONMENT: Lives with: lives alone Lives in: House/apartment Has following equipment at home: Single point cane  OCCUPATION: not employed   PATIENT GOALS:  I want my legs to get smaller and to walk better.     OBJECTIVE: Note: Objective measures were completed at Evaluation unless otherwise noted.  COGNITION: Overall cognitive status: Within functional limits for tasks assessed   PALPATION: Noted induration   OBSERVATIONS / OTHER ASSESSMENTS: noted    LYMPHEDEMA ASSESSMENTS:     LE LANDMARK RIGHT-sitting Eval6/4/25 04/15/24 04/23/24 04/29/24 05/06/24 Right sitting 05/13/24 05/19/24 05/27/24 06/03/24 06/11/24 06/17/24  At groin             30 cm proximal to suprapatella             20 cm proximal to suprapatella 106.5 105 106.5  103.3 103 100 102 102.2 102.5 123 104  10 cm proximal to suprapatella 104.5 104.3 106.2 102.4 102 97 99 99 99.2 120 99  At midpatella / popliteal crease 61.5 under lobe 59 60 59 59 58 60 58 59 65 58   30 cm proximal to floor at lateral plantar foot 87 84.5 69 71 69 81 70 68 67 73 61   20 cm proximal to floor at lateral plantar foot 75 75 84 79.8 79.9 68 79 77 79 68.5 75  10 cm proximal to floor at lateral plantar foot 64.9 63.2 65.8 65 63 61 61.8 62 64 61.5 61.5  Circumference of ankle/heel 41.5 42 40 39.5 40 10 41 42 42.5 41 41.3  5 cm proximal to 1st MTP joint 33 31.5 32 31.9 31 31  31.7 31 32 31 30.5  Across MTP joint 28.8 31 29  28.5 26.5 29.5 28.5 27.8 28.5 28 27.6  Around proximal great toe             (Blank rows = not tested)  LE [facility] LEFT Eval6/4/25 04/15/24 04/23/24 04/29/24 05/06/24 Left sitting 05/13/24 05/19/24 05/27/24 06/03/24 06/11/24  At groin            30 cm proximal to suprapatella            20 cm proximal to suprapatella 147 140 144 143 142 138 136 138 139 140   10 cm proximal to suprapatella 129.8 129.5 131 129.8  130 128 125 128 130 122.5  At midpatella / popliteal crease 79 79 75 68 68 68 67 70 75 75   30 cm proximal to floor at lateral plantar foot 73.5 58 68 69 70 70 71 69 71.5 70.5  20 cm proximal to floor at lateral plantar foot 78.5 71 74 70 69.5 68 68 69 66 60.8  10 cm proximal to floor at lateral plantar foot 75.8 62 64 64 62 62 61.5 60 61 61  Circumference of ankle/heel 42 41.2 42 41 40.5 40 38 42 43.7 41.8  5 cm proximal to 1st MTP joint 32.8 32.2 32.2 31.6 31.5 31 31.5 32 33 31.3  Across MTP joint 27.8 28.3 28.7 28.8 27 28 27 28 29  27.2  Around proximal great toe            (Blank rows = not tested)  FUNCTIONAL TESTS: 04/08/24  30 second sit to stand: 4 1:30 minute walk test: 76 feet.  Pt uses a cane and had to rest once.   06/03/24:   30 second sit to stand x 5 1:30 minute walk test:  90 ft using cane.   TODAY'S TREATMENT: measured:  with noted reduction in size.    06/17/24: Manual to include supraclavicular, deep and superficial abdominal, inguinal to axillary anastomosis followed by B LE. Posterior completed in side lying position.  PT then donned and educated pt on donning trim to fit compression garments.  Therapist used a strap for foot compression.   PATIENT EDUCATION:  04/29/24:  Cervical ROM, cervical retraction, shoulder rolls, trunk Side bend.  04/10/24:  The benefits of completing self manual specifically working on moving fluid from inner to outer thigh . Education details: four aspects to treating lymphedema to include 1)Skin care, 2)exercise, 3)manual, 4) compression .   Person educated: Patient Education method: Explanation, Verbal cues, and Handouts Education comprehension: verbalized understanding  HOME EXERCISE PROGRAM: 04/29/24: Cervical ROM, cervical retraction, shoulder rolls, trunk Side bend.  04/10/24:   Ankle pumps, Long arc quad, hip ab/adduction, marching in place, diaphragm breathing.    ASSESSMENT:  CLINICAL IMPRESSION:    PT measured with significant loss  from last measurement, however, pt was up significantly last measurement.  PT generally down from 06/04/24 measurement.  Pt able to complete 3/4 LAQ now on Lt LE.  At eval mm strength was 1+/5.  PT able to move easier as edema is reducing.  Pt will continue to benefit from skilled PT for manual techniques to reduce volume of LE to improve mobility and decrease risk of cellulitis.    OBJECTIVE IMPAIRMENTS: decreased activity tolerance, decreased mobility, difficulty walking, increased edema, and obesity.   ACTIVITY LIMITATIONS: bathing, toileting, dressing, hygiene/grooming, and locomotion level  PARTICIPATION LIMITATIONS: cleaning, shopping, community activity, and occupation  PERSONAL FACTORS: Fitness, Time since onset of injury/illness/exacerbation, and 1 comorbidity: obesity are also affecting patient's functional outcome.   REHAB POTENTIAL: Good  CLINICAL DECISION MAKING: Evolving/moderate complexity  EVALUATION COMPLEXITY: Moderate  GOALS: Goals reviewed with patient? No  SHORT TERM GOALS: Target date: 05/06/24   PT to be I in skin care to decrease the risk of cellulitis Baseline: Goal status: met  2.  Pt to be I in HEP in order to improve lymphatic circulation Baseline:  Goal status: met  3.  Pt skin integrity to have improved noted by skin no longer flaking off Baseline:  Goal status: met  4.  PT to have lost 4 cm from LE measurement to decrease risk of cellulitis  Baseline:  Goal status:met    LONG TERM GOALS: Target date: 06/03/24  continue goals until 07/04/24   Pt to be I in donning a trim to fit compression garment Baseline: pt did not have a trim to fit garment Goal status: on-going  2.  PT to be using her pump Baseline:  Goal status:met  3.  PT to have lost 10 cm or more from LE measurement to allow improved mobilityu Baseline:  Goal status: on-going  4.  Pt to be able to complete 8 sit to stand in 30 minutes.   Baseline:  Goal status:  on-going   PLAN:  PT FREQUENCY: 3x/week  PT DURATION: 6 weeks continue to 5 more weeks   PLANNED INTERVENTIONS: 97110-Therapeutic exercises, 97535- Self Care, and 02859- Manual therapy  PLAN FOR NEXT SESSION:  Adjust garments as needed.  Continue with total decongestive therapy  Montie Metro, PT CLT [phone]  06/17/2024, 11:06 AM

## 2024-06-19 ENCOUNTER — Ambulatory Visit (HOSPITAL_COMMUNITY): Admitting: Physical Therapy

## 2024-06-19 DIAGNOSIS — I890 Lymphedema, not elsewhere classified: Secondary | ICD-10-CM | POA: Diagnosis not present

## 2024-06-19 NOTE — Therapy (Signed)
 OUTPATIENT PHYSICAL THERAPY LYMPHEDEMA Treatment   Patient Name: Victoria Cummings MRN: [id number] DOB:1966/09/30, 58 y.o., female Today's Date: 06/19/2024    END OF SESSION:  PT End of Session - 06/19/24 1204     Visit Number 25    Number of Visits 33    Date for PT Re-Evaluation 07/03/24    Authorization Type medicare    Progress Note Due on Visit 30    PT Start Time 1001    PT Stop Time 1127    PT Time Calculation (min) 86 min    Activity Tolerance Patient tolerated treatment well    Behavior During Therapy WFL for tasks assessed/performed                    Past Medical History:  Diagnosis Date   Anemia    Bronchitis    DVT (deep venous thrombosis) (HCC)    Hyperlipidemia    Hypertension    Lymph edema    Morbid obesity (HCC)    Pneumonia    No past surgical history on file. Patient Active Problem List   Diagnosis Date Noted   Iron deficiency anemia due to chronic blood loss 04/22/2019   History of heavy vaginal bleeding 04/14/2019   PMB (postmenopausal bleeding) 04/14/2019   Encounter for gynecological examination with Papanicolaou smear of cervix 04/14/2019   Acute pulmonary embolism (HCC)    Anemia    DVT (deep venous thrombosis) (HCC) 10/08/2018    PCP: [facility]  REFERRING PROVIDER: Bucio, Elsa C, FNP  REFERRING DIAG: lymphedema  THERAPY DIAG:  lymphedema  Rationale for Evaluation and Treatment: Rehabilitation  ONSET DATE: Since she was 14 primary lymphedema  SUBJECTIVE:                                                                                                                                                                                           SUBJECTIVE STATEMENT: Pt states that she is able to work on adjusting the trim to fit easier now.  PERTINENT HISTORY:  morbid obesity, HTN, anemia, remote hx of DVT  PAIN:  Are you having pain? Yes .  NPRS scale: 6/10 Pain location: medial ankle  PRECAUTIONS:  Other: cellulitis    WEIGHT BEARING RESTRICTIONS: No  FALLS:  Has patient fallen in last 6 months? No  LIVING ENVIRONMENT: Lives with: lives alone Lives in: House/apartment Has following equipment at home: Single point cane  OCCUPATION: not employed   PATIENT GOALS:  I want my legs to get smaller and to walk better.     OBJECTIVE: Note: Objective measures were completed at Evaluation unless otherwise noted.  COGNITION: Overall cognitive status: Within functional limits for tasks assessed   PALPATION: Noted induration   OBSERVATIONS / OTHER ASSESSMENTS: noted    LYMPHEDEMA ASSESSMENTS:     LE LANDMARK RIGHT-sitting Eval6/4/25 04/15/24 04/23/24 04/29/24 05/06/24 Right  05/13/24 05/19/24 05/27/24 06/03/24 06/11/24 06/17/24  At groin             30 cm proximal to suprapatella             20 cm proximal to suprapatella 106.5 105 106.5  103.3 103 100 102 102.2 102.5 123 104  10 cm proximal to suprapatella 104.5 104.3 106.2 102.4 102 97 99 99 99.2 120 99  At midpatella / popliteal crease 61.5 under lobe 59 60 59 59 58 60 58 59 65 58   30 cm proximal to floor at lateral plantar foot 87 84.5 69 71 69 81 70 68 67 73 61   20 cm proximal to floor at lateral plantar foot 75 75 84 79.8 79.9 68 79 77 79 68.5 75  10 cm proximal to floor at lateral plantar foot 64.9 63.2 65.8 65 63 61 61.8 62 64 61.5 61.5  Circumference of ankle/heel 41.5 42 40 39.5 40 10 41 42 42.5 41 41.3  5 cm proximal to 1st MTP joint 33 31.5 32 31.9 31 31  31.7 31 32 31 30.5  Across MTP joint 28.8 31 29  28.5 26.5 29.5 28.5 27.8 28.5 28 27.6  Around proximal great toe             (Blank rows = not tested)  LE [facility] LEFT Eval6/4/25 04/15/24 04/23/24 04/29/24 05/06/24 Left sitting 05/13/24 05/19/24 05/27/24 06/03/24 06/11/24  At groin            30 cm proximal to suprapatella            20 cm proximal to suprapatella 147 140 144 143 142 138 136 138 139 140   10 cm proximal to suprapatella 129.8 129.5 131 129.8 130 128 125 128 130  122.5  At midpatella / popliteal crease 79 79 75 68 68 68 67 70 75 75   30 cm proximal to floor at lateral plantar foot 73.5 58 68 69 70 70 71 69 71.5 70.5  20 cm proximal to floor at lateral plantar foot 78.5 71 74 70 69.5 68 68 69 66 60.8  10 cm proximal to floor at lateral plantar foot 75.8 62 64 64 62 62 61.5 60 61 61  Circumference of ankle/heel 42 41.2 42 41 40.5 40 38 42 43.7 41.8  5 cm proximal to 1st MTP joint 32.8 32.2 32.2 31.6 31.5 31 31.5 32 33 31.3  Across MTP joint 27.8 28.3 28.7 28.8 27 28 27 28 29  27.2  Around proximal great toe            (Blank rows = not tested)  FUNCTIONAL TESTS: 04/08/24  30 second sit to stand: 4 1:30 minute walk test: 76 feet.  Pt uses a cane and had to rest once.   06/03/24:   30 second sit to stand x 5 1:30 minute walk test:  90 ft using cane.   TODAY'S TREATMENT:  06/19/24: Manual to include supraclavicular, deep and superficial abdominal, inguinal to axillary anastomosis followed by B LE. Posterior completed in side lying position.  PT then donned and educated pt on donning trim to fit compression garments.  Therapist used a strap for foot compression As well as an extra strap at the bottom of each leg to ensure total  coverage.   PATIENT EDUCATION:  04/29/24:  Cervical ROM, cervical retraction, shoulder rolls, trunk Side bend.  04/10/24:  The benefits of completing self manual specifically working on moving fluid from inner to outer thigh . Education details: four aspects to treating lymphedema to include 1)Skin care, 2)exercise, 3)manual, 4) compression .   Person educated: Patient Education method: Explanation, Verbal cues, and Handouts Education comprehension: verbalized understanding  HOME EXERCISE PROGRAM: 04/29/24: Cervical ROM, cervical retraction, shoulder rolls, trunk Side bend.  04/10/24:   Ankle pumps, Long arc quad, hip ab/adduction, marching in place, diaphragm breathing.    ASSESSMENT:  CLINICAL IMPRESSION:    Noted decreased  induration of LT LE.  PT able to move easier as edema is reducing.  Pt will continue to benefit from skilled PT for manual techniques to reduce volume of LE to improve mobility and decrease risk of cellulitis.    OBJECTIVE IMPAIRMENTS: decreased activity tolerance, decreased mobility, difficulty walking, increased edema, and obesity.   ACTIVITY LIMITATIONS: bathing, toileting, dressing, hygiene/grooming, and locomotion level  PARTICIPATION LIMITATIONS: cleaning, shopping, community activity, and occupation  PERSONAL FACTORS: Fitness, Time since onset of injury/illness/exacerbation, and 1 comorbidity: obesity are also affecting patient's functional outcome.   REHAB POTENTIAL: Good  CLINICAL DECISION MAKING: Evolving/moderate complexity  EVALUATION COMPLEXITY: Moderate  GOALS: Goals reviewed with patient? No  SHORT TERM GOALS: Target date: 05/06/24   PT to be I in skin care to decrease the risk of cellulitis Baseline: Goal status: met  2.  Pt to be I in HEP in order to improve lymphatic circulation Baseline:  Goal status: met  3.  Pt skin integrity to have improved noted by skin no longer flaking off Baseline:  Goal status: met  4.  PT to have lost 4 cm from LE measurement to decrease risk of cellulitis  Baseline:  Goal status:met    LONG TERM GOALS: Target date: 06/03/24  continue goals until 07/04/24   Pt to be I in donning a trim to fit compression garment Baseline: pt did not have a trim to fit garment Goal status: on-going  2.  PT to be using her pump Baseline:  Goal status:met  3.  PT to have lost 10 cm or more from LE measurement to allow improved mobilityu Baseline:  Goal status: on-going  4.  Pt to be able to complete 8 sit to stand in 30 minutes.   Baseline:  Goal status: on-going   PLAN:  PT FREQUENCY: 3x/week  PT DURATION: 6 weeks continue to 5 more weeks   PLANNED INTERVENTIONS: 97110-Therapeutic exercises, 97535- Self Care, and 02859- Manual  therapy  PLAN FOR NEXT SESSION:  Adjust garments as needed.  Continue with total decongestive therapy  Montie Metro, PT CLT [phone]  06/19/2024, 12:04 PM

## 2024-06-23 ENCOUNTER — Ambulatory Visit (HOSPITAL_COMMUNITY): Admitting: Physical Therapy

## 2024-06-23 DIAGNOSIS — I890 Lymphedema, not elsewhere classified: Secondary | ICD-10-CM

## 2024-06-23 NOTE — Therapy (Signed)
 OUTPATIENT PHYSICAL THERAPY LYMPHEDEMA Treatment   Patient Name: Victoria Cummings MRN: [id number] DOB:Jul 23, 1966, 58 y.o., female Today's Date: 06/23/2024    END OF SESSION:  PT End of Session - 06/23/24 1528     Visit Number 26    Number of Visits 33    Date for PT Re-Evaluation 07/03/24    Authorization Type medicare    Progress Note Due on Visit 30    PT Start Time 1334    PT Stop Time 1526    PT Time Calculation (min) 112 min    Activity Tolerance Patient tolerated treatment well    Behavior During Therapy WFL for tasks assessed/performed                     Past Medical History:  Diagnosis Date   Anemia    Bronchitis    DVT (deep venous thrombosis) (HCC)    Hyperlipidemia    Hypertension    Lymph edema    Morbid obesity (HCC)    Pneumonia    No past surgical history on file. Patient Active Problem List   Diagnosis Date Noted   Iron deficiency anemia due to chronic blood loss 04/22/2019   History of heavy vaginal bleeding 04/14/2019   PMB (postmenopausal bleeding) 04/14/2019   Encounter for gynecological examination with Papanicolaou smear of cervix 04/14/2019   Acute pulmonary embolism (HCC)    Anemia    DVT (deep venous thrombosis) (HCC) 10/08/2018    PCP: [facility]  REFERRING PROVIDER: Bucio, Silvio BROCKS, FNP  REFERRING DIAG: lymphedema  THERAPY DIAG:  lymphedema  Rationale for Evaluation and Treatment: Rehabilitation  ONSET DATE: Since she was 14 primary lymphedema  SUBJECTIVE:                                                                                                                                                                                           SUBJECTIVE STATEMENT: Pt states that she has an irritated area on the medial aspect of her Lt ankle  PERTINENT HISTORY:  morbid obesity, HTN, anemia, remote hx of DVT  PAIN:  Are you having pain? Yes .  NPRS scale: 6/10 Pain location: medial ankle  PRECAUTIONS:  Other: cellulitis    WEIGHT BEARING RESTRICTIONS: No  FALLS:  Has patient fallen in last 6 months? No  LIVING ENVIRONMENT: Lives with: lives alone Lives in: House/apartment Has following equipment at home: Single point cane  OCCUPATION: not employed   PATIENT GOALS:  I want my legs to get smaller and to walk better.     OBJECTIVE: Note: Objective measures were completed at Evaluation unless otherwise  noted.  COGNITION: Overall cognitive status: Within functional limits for tasks assessed   PALPATION: Noted induration   OBSERVATIONS / OTHER ASSESSMENTS: noted    LYMPHEDEMA ASSESSMENTS:     LE LANDMARK RIGHT-sitting Eval6/4/25 04/15/24 04/23/24 04/29/24 05/06/24 Right  05/13/24 05/19/24 05/27/24 06/03/24 06/11/24 06/17/24 06/23/24  At groin              30 cm proximal to suprapatella              20 cm proximal to suprapatella 106.5 105 106.5  103.3 103 100 102 102.2 102.5 123 104 103  10 cm proximal to suprapatella 104.5 104.3 106.2 102.4 102 97 99 99 99.2 120 99 98  At midpatella / popliteal crease 61.5 under lobe 59 60 59 59 58 60 58 59 65 58  59  30 cm proximal to floor at lateral plantar foot 87 84.5 69 71 69 81 70 68 67 73 61  65  20 cm proximal to floor at lateral plantar foot 75 75 84 79.8 79.9 68 79 77 79 68.5 75 79  10 cm proximal to floor at lateral plantar foot 64.9 63.2 65.8 65 63 61 61.8 62 64 61.5 61.5 64  Circumference of ankle/heel 41.5 42 40 39.5 40 10 41 42 42.5 41 41.3 40  5 cm proximal to 1st MTP joint 33 31.5 32 31.9 31 31  31.7 31 32 31 30.5 31.7  Across MTP joint 28.8 31 29  28.5 26.5 29.5 28.5 27.8 28.5 28 27.6 29.7  Around proximal great toe              (Blank rows = not tested)  LE [facility] LEFT Eval6/4/25 04/15/24 04/23/24 04/29/24 05/06/24 Left sitting 05/13/24 05/19/24 05/27/24 06/03/24 06/11/24 06/23/24  At groin             30 cm proximal to suprapatella             20 cm proximal to suprapatella 147 140 144 143 142 138 136 138 139 140  139  10 cm proximal to  suprapatella 129.8 129.5 131 129.8 130 128 125 128 130 122.5 127  At midpatella / popliteal crease 79 79 75 68 68 68 67 70 75 75  67  30 cm proximal to floor at lateral plantar foot 73.5 58 68 69 70 70 71 69 71.5 70.5 68  20 cm proximal to floor at lateral plantar foot 78.5 71 74 70 69.5 68 68 69 66 60.8 68  10 cm proximal to floor at lateral plantar foot 75.8 62 64 64 62 62 61.5 60 61 61 64  Circumference of ankle/heel 42 41.2 42 41 40.5 40 38 42 43.7 41.8 40  5 cm proximal to 1st MTP joint 32.8 32.2 32.2 31.6 31.5 31 31.5 32 33 31.3 31.6  Across MTP joint 27.8 28.3 28.7 28.8 27 28 27 28 29  27.2 28.5  Around proximal great toe             (Blank rows = not tested)  FUNCTIONAL TESTS: 04/08/24  30 second sit to stand: 4 1:30 minute walk test: 76 feet.  Pt uses a cane and had to rest once.   06/03/24:   30 second sit to stand x 5 1:30 minute walk test:  90 ft using cane.   TODAY'S TREATMENT: measurement 06/23/24:  Manual to include supraclavicular, deep and superficial abdominal, inguinal to axillary anastomosis followed by B LE. Posterior completed in side lying position.  PT then donned and educated pt on  donning trim to fit compression garments.  Therapist used a strap for foot compression B PATIENT EDUCATION:  04/29/24:  Cervical ROM, cervical retraction, shoulder rolls, trunk Side bend.  04/10/24:  The benefits of completing self manual specifically working on moving fluid from inner to outer thigh . Education details: four aspects to treating lymphedema to include 1)Skin care, 2)exercise, 3)manual, 4) compression .   Person educated: Patient Education method: Explanation, Verbal cues, and Handouts Education comprehension: verbalized understanding  HOME EXERCISE PROGRAM: 04/29/24: Cervical ROM, cervical retraction, shoulder rolls, trunk Side bend.  04/10/24:   Ankle pumps, Long arc quad, hip ab/adduction, marching in place, diaphragm breathing.    ASSESSMENT:  CLINICAL IMPRESSION:    Pt  normally comes to therapy in the mornings.  Pt states that her leg will be the smallest in the mornings and continue to increase in size as the day goes on.   Pt without compression since 4:00am which may explain why pt was up with all measurements today.  Therapist spent extra time on Lt thigh as this area is so full of fluid there is no way that the fluid from her LE can be pushed up.    Pt will continue to benefit from skilled PT for manual techniques to reduce volume of LE to improve mobility and decrease risk of cellulitis.    OBJECTIVE IMPAIRMENTS: decreased activity tolerance, decreased mobility, difficulty walking, increased edema, and obesity.   ACTIVITY LIMITATIONS: bathing, toileting, dressing, hygiene/grooming, and locomotion level  PARTICIPATION LIMITATIONS: cleaning, shopping, community activity, and occupation  PERSONAL FACTORS: Fitness, Time since onset of injury/illness/exacerbation, and 1 comorbidity: obesity are also affecting patient's functional outcome.   REHAB POTENTIAL: Good  CLINICAL DECISION MAKING: Evolving/moderate complexity  EVALUATION COMPLEXITY: Moderate  GOALS: Goals reviewed with patient? No  SHORT TERM GOALS: Target date: 05/06/24   PT to be I in skin care to decrease the risk of cellulitis Baseline: Goal status: met  2.  Pt to be I in HEP in order to improve lymphatic circulation Baseline:  Goal status: met  3.  Pt skin integrity to have improved noted by skin no longer flaking off Baseline:  Goal status: met  4.  PT to have lost 4 cm from LE measurement to decrease risk of cellulitis  Baseline:  Goal status:met    LONG TERM GOALS: Target date: 06/03/24  continue goals until 07/04/24   Pt to be I in donning a trim to fit compression garment Baseline: pt did not have a trim to fit garment Goal status: on-going  2.  PT to be using her pump Baseline:  Goal status:met  3.  PT to have lost 10 cm or more from LE measurement to allow improved  mobilityu Baseline:  Goal status: on-going  4.  Pt to be able to complete 8 sit to stand in 30 minutes.   Baseline:  Goal status: on-going   PLAN:  PT FREQUENCY: 3x/week  PT DURATION: 6 weeks continue to 5 more weeks   PLANNED INTERVENTIONS: 97110-Therapeutic exercises, 97535- Self Care, and 02859- Manual therapy  PLAN FOR NEXT SESSION:  Adjust garments as needed.  Continue with total decongestive therapy  Montie Metro, PT CLT [phone]  06/23/2024, 3:29 PM

## 2024-06-25 ENCOUNTER — Ambulatory Visit (HOSPITAL_COMMUNITY): Admitting: Physical Therapy

## 2024-06-25 DIAGNOSIS — I890 Lymphedema, not elsewhere classified: Secondary | ICD-10-CM | POA: Diagnosis not present

## 2024-06-25 NOTE — Therapy (Signed)
 OUTPATIENT PHYSICAL THERAPY LYMPHEDEMA Treatment   Patient Name: Victoria Cummings MRN: [id number] DOB:September 24, 1966, 58 y.o., female Today's Date: 06/25/2024    END OF SESSION:  PT End of Session - 06/25/24 1522     Visit Number 27    Number of Visits 33    Date for PT Re-Evaluation 07/03/24    Authorization Type medicare    Progress Note Due on Visit 30    PT Start Time 1350    PT Stop Time 1508    PT Time Calculation (min) 78 min    Activity Tolerance Patient tolerated treatment well    Behavior During Therapy WFL for tasks assessed/performed                     Past Medical History:  Diagnosis Date   Anemia    Bronchitis    DVT (deep venous thrombosis) (HCC)    Hyperlipidemia    Hypertension    Lymph edema    Morbid obesity (HCC)    Pneumonia    No past surgical history on file. Patient Active Problem List   Diagnosis Date Noted   Iron deficiency anemia due to chronic blood loss 04/22/2019   History of heavy vaginal bleeding 04/14/2019   PMB (postmenopausal bleeding) 04/14/2019   Encounter for gynecological examination with Papanicolaou smear of cervix 04/14/2019   Acute pulmonary embolism (HCC)    Anemia    DVT (deep venous thrombosis) (HCC) 10/08/2018    PCP: [facility]  REFERRING PROVIDER: Bucio, Silvio BROCKS, FNP  REFERRING DIAG: lymphedema  THERAPY DIAG:  lymphedema  Rationale for Evaluation and Treatment: Rehabilitation  ONSET DATE: Since she was 14 primary lymphedema  SUBJECTIVE:                                                                                                                                                                                           SUBJECTIVE STATEMENT:PT requests cotton at LT ankle fold and mid  LT mid calf fold.  Pt to department with trim to fit donned as requested but thigh garment had slid down B.  Pt states she is unable to get it tight enough on her own at this time to allow walking  without sliding.  PERTINENT HISTORY:  morbid obesity, HTN, anemia, remote hx of DVT  PAIN:  Are you having pain? Yes .  NPRS scale: 4/10 Pain location: medial ankle  PRECAUTIONS: Other: cellulitis    WEIGHT BEARING RESTRICTIONS: No  FALLS:  Has patient fallen in last 6 months? No  LIVING ENVIRONMENT: Lives with: lives alone Lives in: House/apartment Has following equipment at home:  Single point cane  OCCUPATION: not employed   PATIENT GOALS:  I want my legs to get smaller and to walk better.     OBJECTIVE: Note: Objective measures were completed at Evaluation unless otherwise noted.  COGNITION: Overall cognitive status: Within functional limits for tasks assessed   PALPATION: Noted induration   OBSERVATIONS / OTHER ASSESSMENTS: noted    LYMPHEDEMA ASSESSMENTS:     LE LANDMARK RIGHT-sitting Eval6/4/25 04/15/24 04/23/24 04/29/24 05/06/24 Right  05/13/24 05/19/24 05/27/24 06/03/24 06/11/24 06/17/24 06/23/24  At groin              30 cm proximal to suprapatella              20 cm proximal to suprapatella 106.5 105 106.5  103.3 103 100 102 102.2 102.5 123 104 103  10 cm proximal to suprapatella 104.5 104.3 106.2 102.4 102 97 99 99 99.2 120 99 98  At midpatella / popliteal crease 61.5 under lobe 59 60 59 59 58 60 58 59 65 58  59  30 cm proximal to floor at lateral plantar foot 87 84.5 69 71 69 81 70 68 67 73 61  65  20 cm proximal to floor at lateral plantar foot 75 75 84 79.8 79.9 68 79 77 79 68.5 75 79  10 cm proximal to floor at lateral plantar foot 64.9 63.2 65.8 65 63 61 61.8 62 64 61.5 61.5 64  Circumference of ankle/heel 41.5 42 40 39.5 40 10 41 42 42.5 41 41.3 40  5 cm proximal to 1st MTP joint 33 31.5 32 31.9 31 31  31.7 31 32 31 30.5 31.7  Across MTP joint 28.8 31 29  28.5 26.5 29.5 28.5 27.8 28.5 28 27.6 29.7  Around proximal great toe              (Blank rows = not tested)  LE [facility] LEFT Eval6/4/25 04/15/24 04/23/24 04/29/24 05/06/24 Left sitting 05/13/24 05/19/24 05/27/24  06/03/24 06/11/24 06/23/24  At groin             30 cm proximal to suprapatella             20 cm proximal to suprapatella 147 140 144 143 142 138 136 138 139 140  139  10 cm proximal to suprapatella 129.8 129.5 131 129.8 130 128 125 128 130 122.5 127  At midpatella / popliteal crease 79 79 75 68 68 68 67 70 75 75  67  30 cm proximal to floor at lateral plantar foot 73.5 58 68 69 70 70 71 69 71.5 70.5 68  20 cm proximal to floor at lateral plantar foot 78.5 71 74 70 69.5 68 68 69 66 60.8 68  10 cm proximal to floor at lateral plantar foot 75.8 62 64 64 62 62 61.5 60 61 61 64  Circumference of ankle/heel 42 41.2 42 41 40.5 40 38 42 43.7 41.8 40  5 cm proximal to 1st MTP joint 32.8 32.2 32.2 31.6 31.5 31 31.5 32 33 31.3 31.6  Across MTP joint 27.8 28.3 28.7 28.8 27 28 27 28 29  27.2 28.5  Around proximal great toe             (Blank rows = not tested)  FUNCTIONAL TESTS: 04/08/24  30 second sit to stand: 4 1:30 minute walk test: 76 feet.  Pt uses a cane and had to rest once.   06/03/24:   30 second sit to stand x 5 1:30 minute walk test:  90 ft using cane.  TODAY'S TREATMENT: measurement 06/25/24:  Manual to include supraclavicular, deep and superficial abdominal, inguinal to axillary anastomosis followed by B LE. Posterior completed in side lying position.  PT then donned and educated pt on donning trim to fit compression garments.  Therapist used a strap for foot compression B PATIENT EDUCATION:  04/29/24:  Cervical ROM, cervical retraction, shoulder rolls, trunk Side bend.  04/10/24:  The benefits of completing self manual specifically working on moving fluid from inner to outer thigh . Education details: four aspects to treating lymphedema to include 1)Skin care, 2)exercise, 3)manual, 4) compression .   Person educated: Patient Education method: Explanation, Verbal cues, and Handouts Education comprehension: verbalized understanding  HOME EXERCISE PROGRAM: 04/29/24: Cervical ROM, cervical  retraction, shoulder rolls, trunk Side bend.  04/10/24:   Ankle pumps, Long arc quad, hip ab/adduction, marching in place, diaphragm breathing.    ASSESSMENT:  CLINICAL IMPRESSION:    PT has significant decreased induration B with wearing the trim to fit compression despite the fact that the thigh garment slid down to knee level B.    Pt with noted smaller LE B.  Pt will continue to benefit from skilled PT for manual techniques to reduce volume of LE to improve mobility and decrease risk of cellulitis.    OBJECTIVE IMPAIRMENTS: decreased activity tolerance, decreased mobility, difficulty walking, increased edema, and obesity.   ACTIVITY LIMITATIONS: bathing, toileting, dressing, hygiene/grooming, and locomotion level  PARTICIPATION LIMITATIONS: cleaning, shopping, community activity, and occupation  PERSONAL FACTORS: Fitness, Time since onset of injury/illness/exacerbation, and 1 comorbidity: obesity are also affecting patient's functional outcome.   REHAB POTENTIAL: Good  CLINICAL DECISION MAKING: Evolving/moderate complexity  EVALUATION COMPLEXITY: Moderate  GOALS: Goals reviewed with patient? No  SHORT TERM GOALS: Target date: 05/06/24   PT to be I in skin care to decrease the risk of cellulitis Baseline: Goal status: met  2.  Pt to be I in HEP in order to improve lymphatic circulation Baseline:  Goal status: met  3.  Pt skin integrity to have improved noted by skin no longer flaking off Baseline:  Goal status: met  4.  PT to have lost 4 cm from LE measurement to decrease risk of cellulitis  Baseline:  Goal status:met    LONG TERM GOALS: Target date: 06/03/24  continue goals until 07/04/24   Pt to be I in donning a trim to fit compression garment Baseline: pt did not have a trim to fit garment Goal status: on-going  2.  PT to be using her pump Baseline:  Goal status:met  3.  PT to have lost 10 cm or more from LE measurement to allow improved mobilityu Baseline:   Goal status: on-going  4.  Pt to be able to complete 8 sit to stand in 30 minutes.   Baseline:  Goal status: on-going   PLAN:  PT FREQUENCY: 3x/week  PT DURATION: 6 weeks continue to 5 more weeks   PLANNED INTERVENTIONS: 97110-Therapeutic exercises, 97535- Self Care, and 02859- Manual therapy  PLAN FOR NEXT SESSION:  Continue to encourage pt to wear the trim to fit all day long. Cut  trim to fit garments as needed for proper fit.    Continue with total decongestive therapy  Montie Metro, PT CLT [phone]  06/25/2024, 3:23 PM

## 2024-06-30 ENCOUNTER — Encounter (HOSPITAL_COMMUNITY): Admitting: Physical Therapy

## 2024-07-02 ENCOUNTER — Ambulatory Visit (HOSPITAL_COMMUNITY): Admitting: Physical Therapy

## 2024-07-02 ENCOUNTER — Encounter (HOSPITAL_COMMUNITY): Payer: Self-pay | Admitting: Physical Therapy

## 2024-07-02 NOTE — Therapy (Unsigned)
Error Osiel Stick, PT CLT [phone]  

## 2024-07-07 ENCOUNTER — Ambulatory Visit (HOSPITAL_COMMUNITY): Admitting: Physical Therapy

## 2024-07-09 ENCOUNTER — Encounter (HOSPITAL_COMMUNITY): Admitting: Physical Therapy

## 2024-07-10 ENCOUNTER — Encounter (HOSPITAL_COMMUNITY): Payer: Self-pay | Admitting: Physical Therapy

## 2024-07-10 NOTE — Therapy (Unsigned)
 [facility] Outpatient Rehabilitation at [facility] [address] [place], [place], [address] Phone: [phone]   Fax:  [phone]  Patient Details  Name: Victoria Cummings MRN: [id number] Date of Birth: 06-29-1966 Referring Provider:  No ref. provider found  Encounter Date: 07/10/2024  PHYSICAL THERAPY DISCHARGE SUMMARY  Visits from Start of Care: 27  Current functional level related to goals / functional outcomes: Unknown due to the fact that pt did not return.  As of her last visit [name] had lost between 3-12 cm of fluid from measured areas B.    Remaining deficits: Continued to have significant induration B    Education / Equipment: HEP, self manual and how to don trim to fit compression garment.    Patient agrees to discharge. Patient goals were partially met. Patient is being discharged due to the patient's request.   PT has to move and is unable to attend therapy at this time.  Montie Metro, PT CLT [phone]  07/10/2024, 2:41 PM  [facility] Outpatient Rehabilitation at [facility] [address] [place], [place], [address] Phone: [phone]   Fax:  [phone]

## 2024-07-13 ENCOUNTER — Ambulatory Visit (HOSPITAL_COMMUNITY)

## 2024-07-15 ENCOUNTER — Encounter (HOSPITAL_COMMUNITY): Admitting: Physical Therapy

## 2024-07-17 ENCOUNTER — Encounter (HOSPITAL_COMMUNITY): Admitting: Physical Therapy

## 2024-07-20 ENCOUNTER — Encounter (HOSPITAL_COMMUNITY): Admitting: Physical Therapy

## 2024-07-22 ENCOUNTER — Encounter (HOSPITAL_COMMUNITY): Admitting: Physical Therapy

## 2024-07-24 ENCOUNTER — Encounter (HOSPITAL_COMMUNITY)

## 2024-07-27 ENCOUNTER — Encounter (HOSPITAL_COMMUNITY)

## 2024-07-29 ENCOUNTER — Encounter (HOSPITAL_COMMUNITY)

## 2024-07-31 ENCOUNTER — Encounter (HOSPITAL_COMMUNITY)

## 2024-08-03 ENCOUNTER — Encounter (HOSPITAL_COMMUNITY): Admitting: Physical Therapy

## 2024-08-05 ENCOUNTER — Encounter (HOSPITAL_COMMUNITY): Admitting: Physical Therapy

## 2024-08-07 ENCOUNTER — Encounter (HOSPITAL_COMMUNITY): Admitting: Physical Therapy
